# Patient Record
Sex: Female | Born: 1955 | Race: Black or African American | Hispanic: No | Smoking: Never smoker
Health system: Southern US, Community
[De-identification: ages and names within clinical notes are randomized; demographics above are authoritative.]

## PROBLEM LIST (undated history)

## (undated) DIAGNOSIS — K219 Gastro-esophageal reflux disease without esophagitis: Secondary | ICD-10-CM

## (undated) DIAGNOSIS — M1990 Unspecified osteoarthritis, unspecified site: Secondary | ICD-10-CM

## (undated) DIAGNOSIS — I10 Essential (primary) hypertension: Secondary | ICD-10-CM

## (undated) HISTORY — PX: CHOLECYSTECTOMY: SHX55

## (undated) HISTORY — PX: TUBAL LIGATION: SHX77

---

## 1998-07-10 ENCOUNTER — Emergency Department (HOSPITAL_COMMUNITY): Admission: EM | Admit: 1998-07-10 | Discharge: 1998-07-10 | Payer: Self-pay | Admitting: Emergency Medicine

## 1998-11-11 ENCOUNTER — Encounter: Admission: RE | Admit: 1998-11-11 | Discharge: 1999-02-09 | Payer: Self-pay | Admitting: Anesthesiology

## 1999-04-22 ENCOUNTER — Other Ambulatory Visit: Admission: RE | Admit: 1999-04-22 | Discharge: 1999-04-22 | Payer: Self-pay | Admitting: *Deleted

## 2000-05-29 ENCOUNTER — Other Ambulatory Visit: Admission: RE | Admit: 2000-05-29 | Discharge: 2000-05-29 | Payer: Self-pay | Admitting: Family Medicine

## 2001-04-19 ENCOUNTER — Encounter: Payer: Self-pay | Admitting: Internal Medicine

## 2001-04-19 ENCOUNTER — Ambulatory Visit (HOSPITAL_COMMUNITY): Admission: RE | Admit: 2001-04-19 | Discharge: 2001-04-19 | Payer: Self-pay | Admitting: Internal Medicine

## 2001-05-27 ENCOUNTER — Other Ambulatory Visit: Admission: RE | Admit: 2001-05-27 | Discharge: 2001-05-27 | Payer: Self-pay | Admitting: Family Medicine

## 2002-02-28 ENCOUNTER — Encounter: Payer: Self-pay | Admitting: *Deleted

## 2002-02-28 ENCOUNTER — Encounter: Admission: RE | Admit: 2002-02-28 | Discharge: 2002-02-28 | Payer: Self-pay | Admitting: *Deleted

## 2002-07-02 ENCOUNTER — Emergency Department (HOSPITAL_COMMUNITY): Admission: EM | Admit: 2002-07-02 | Discharge: 2002-07-02 | Payer: Self-pay | Admitting: Emergency Medicine

## 2002-07-02 ENCOUNTER — Encounter: Payer: Self-pay | Admitting: Emergency Medicine

## 2002-07-07 ENCOUNTER — Other Ambulatory Visit: Admission: RE | Admit: 2002-07-07 | Discharge: 2002-07-07 | Payer: Self-pay | Admitting: Family Medicine

## 2002-07-19 ENCOUNTER — Encounter: Payer: Self-pay | Admitting: Orthopedic Surgery

## 2002-07-19 ENCOUNTER — Ambulatory Visit (HOSPITAL_COMMUNITY): Admission: RE | Admit: 2002-07-19 | Discharge: 2002-07-19 | Payer: Self-pay | Admitting: Orthopedic Surgery

## 2002-10-20 ENCOUNTER — Ambulatory Visit (HOSPITAL_COMMUNITY)
Admission: RE | Admit: 2002-10-20 | Discharge: 2002-10-20 | Payer: Self-pay | Admitting: Physical Medicine and Rehabilitation

## 2002-10-20 ENCOUNTER — Encounter: Payer: Self-pay | Admitting: Physical Medicine and Rehabilitation

## 2003-06-17 ENCOUNTER — Encounter: Admission: RE | Admit: 2003-06-17 | Discharge: 2003-06-17 | Payer: Self-pay | Admitting: Family Medicine

## 2003-07-08 ENCOUNTER — Other Ambulatory Visit: Admission: RE | Admit: 2003-07-08 | Discharge: 2003-07-08 | Payer: Self-pay | Admitting: Family Medicine

## 2004-03-15 ENCOUNTER — Ambulatory Visit: Payer: Self-pay | Admitting: Family Medicine

## 2004-08-19 ENCOUNTER — Ambulatory Visit: Payer: Self-pay | Admitting: Family Medicine

## 2004-08-19 ENCOUNTER — Other Ambulatory Visit: Admission: RE | Admit: 2004-08-19 | Discharge: 2004-08-19 | Payer: Self-pay | Admitting: Family Medicine

## 2004-10-27 ENCOUNTER — Ambulatory Visit: Payer: Self-pay | Admitting: Family Medicine

## 2006-06-18 ENCOUNTER — Encounter: Payer: Self-pay | Admitting: *Deleted

## 2006-06-18 ENCOUNTER — Encounter (INDEPENDENT_AMBULATORY_CARE_PROVIDER_SITE_OTHER): Payer: Self-pay | Admitting: Specialist

## 2006-06-18 ENCOUNTER — Other Ambulatory Visit: Admission: RE | Admit: 2006-06-18 | Discharge: 2006-06-18 | Payer: Self-pay | Admitting: Family Medicine

## 2006-06-18 ENCOUNTER — Ambulatory Visit: Payer: Self-pay | Admitting: Family Medicine

## 2006-06-18 DIAGNOSIS — R51 Headache: Secondary | ICD-10-CM | POA: Insufficient documentation

## 2006-06-18 DIAGNOSIS — R519 Headache, unspecified: Secondary | ICD-10-CM | POA: Insufficient documentation

## 2006-06-18 DIAGNOSIS — R87619 Unspecified abnormal cytological findings in specimens from cervix uteri: Secondary | ICD-10-CM | POA: Insufficient documentation

## 2006-06-18 DIAGNOSIS — I10 Essential (primary) hypertension: Secondary | ICD-10-CM | POA: Insufficient documentation

## 2006-06-18 LAB — CONVERTED CEMR LAB
Bilirubin Urine: NEGATIVE
Blood in Urine, dipstick: NEGATIVE
Glucose, Urine, Semiquant: NEGATIVE
Ketones, urine, test strip: NEGATIVE
Nitrite: NEGATIVE
Pap Smear: ABNORMAL
Protein, U semiquant: NEGATIVE
Specific Gravity, Urine: 1.015
Urobilinogen, UA: 0.2
pH: 6.5

## 2006-09-18 ENCOUNTER — Ambulatory Visit: Payer: Self-pay | Admitting: Family Medicine

## 2006-09-18 DIAGNOSIS — Z87440 Personal history of urinary (tract) infections: Secondary | ICD-10-CM | POA: Insufficient documentation

## 2006-09-18 LAB — CONVERTED CEMR LAB
Bilirubin Urine: NEGATIVE
Glucose, Urine, Semiquant: NEGATIVE
Ketones, urine, test strip: NEGATIVE
Nitrite: NEGATIVE
Protein, U semiquant: NEGATIVE
Specific Gravity, Urine: 1.02
Urobilinogen, UA: NEGATIVE
pH: 6

## 2006-09-19 ENCOUNTER — Encounter: Payer: Self-pay | Admitting: Family Medicine

## 2006-09-20 ENCOUNTER — Encounter: Payer: Self-pay | Admitting: Family Medicine

## 2006-09-24 ENCOUNTER — Encounter (INDEPENDENT_AMBULATORY_CARE_PROVIDER_SITE_OTHER): Payer: Self-pay | Admitting: *Deleted

## 2007-07-03 ENCOUNTER — Encounter: Payer: Self-pay | Admitting: Family Medicine

## 2007-07-04 ENCOUNTER — Encounter (INDEPENDENT_AMBULATORY_CARE_PROVIDER_SITE_OTHER): Payer: Self-pay | Admitting: *Deleted

## 2008-04-18 ENCOUNTER — Emergency Department (HOSPITAL_BASED_OUTPATIENT_CLINIC_OR_DEPARTMENT_OTHER): Admission: EM | Admit: 2008-04-18 | Discharge: 2008-04-18 | Payer: Self-pay | Admitting: Emergency Medicine

## 2010-06-21 LAB — DIFFERENTIAL
Basophils Absolute: 0 10*3/uL (ref 0.0–0.1)
Lymphocytes Relative: 6 % — ABNORMAL LOW (ref 12–46)
Lymphs Abs: 0.6 10*3/uL — ABNORMAL LOW (ref 0.7–4.0)
Monocytes Absolute: 0.5 10*3/uL (ref 0.1–1.0)
Monocytes Relative: 5 % (ref 3–12)
Neutro Abs: 8 10*3/uL — ABNORMAL HIGH (ref 1.7–7.7)

## 2010-06-21 LAB — COMPREHENSIVE METABOLIC PANEL
Albumin: 4.4 g/dL (ref 3.5–5.2)
BUN: 17 mg/dL (ref 6–23)
Calcium: 9.7 mg/dL (ref 8.4–10.5)
Creatinine, Ser: 0.8 mg/dL (ref 0.4–1.2)
Total Bilirubin: 1 mg/dL (ref 0.3–1.2)
Total Protein: 8.4 g/dL — ABNORMAL HIGH (ref 6.0–8.3)

## 2010-06-21 LAB — CBC
HCT: 43.5 % (ref 36.0–46.0)
MCHC: 32.3 g/dL (ref 30.0–36.0)
MCV: 90.8 fL (ref 78.0–100.0)
Platelets: 266 10*3/uL (ref 150–400)
RDW: 13.5 % (ref 11.5–15.5)

## 2011-02-26 ENCOUNTER — Other Ambulatory Visit: Payer: Self-pay

## 2011-02-26 ENCOUNTER — Emergency Department (INDEPENDENT_AMBULATORY_CARE_PROVIDER_SITE_OTHER): Payer: Managed Care, Other (non HMO)

## 2011-02-26 ENCOUNTER — Encounter: Payer: Self-pay | Admitting: Family Medicine

## 2011-02-26 ENCOUNTER — Emergency Department (HOSPITAL_BASED_OUTPATIENT_CLINIC_OR_DEPARTMENT_OTHER)
Admission: EM | Admit: 2011-02-26 | Discharge: 2011-02-26 | Disposition: A | Discharge status: Other Acute Inpatient Hospital | Payer: Managed Care, Other (non HMO) | Attending: Emergency Medicine | Admitting: Emergency Medicine

## 2011-02-26 DIAGNOSIS — R1013 Epigastric pain: Secondary | ICD-10-CM

## 2011-02-26 DIAGNOSIS — M549 Dorsalgia, unspecified: Secondary | ICD-10-CM

## 2011-02-26 DIAGNOSIS — I10 Essential (primary) hypertension: Secondary | ICD-10-CM | POA: Insufficient documentation

## 2011-02-26 DIAGNOSIS — Z79899 Other long term (current) drug therapy: Secondary | ICD-10-CM | POA: Insufficient documentation

## 2011-02-26 DIAGNOSIS — R079 Chest pain, unspecified: Secondary | ICD-10-CM | POA: Insufficient documentation

## 2011-02-26 DIAGNOSIS — K8020 Calculus of gallbladder without cholecystitis without obstruction: Secondary | ICD-10-CM

## 2011-02-26 DIAGNOSIS — I441 Atrioventricular block, second degree: Secondary | ICD-10-CM | POA: Insufficient documentation

## 2011-02-26 DIAGNOSIS — R110 Nausea: Secondary | ICD-10-CM | POA: Insufficient documentation

## 2011-02-26 DIAGNOSIS — J9819 Other pulmonary collapse: Secondary | ICD-10-CM

## 2011-02-26 DIAGNOSIS — K219 Gastro-esophageal reflux disease without esophagitis: Secondary | ICD-10-CM | POA: Insufficient documentation

## 2011-02-26 DIAGNOSIS — R109 Unspecified abdominal pain: Secondary | ICD-10-CM

## 2011-02-26 HISTORY — DX: Gastro-esophageal reflux disease without esophagitis: K21.9

## 2011-02-26 HISTORY — DX: Essential (primary) hypertension: I10

## 2011-02-26 LAB — COMPREHENSIVE METABOLIC PANEL
Alkaline Phosphatase: 106 U/L (ref 39–117)
BUN: 17 mg/dL (ref 6–23)
CO2: 27 mEq/L (ref 19–32)
GFR calc Af Amer: 82 mL/min — ABNORMAL LOW (ref >90)
GFR calc non Af Amer: 71 mL/min — ABNORMAL LOW (ref >90)
Glucose, Bld: 99 mg/dL (ref 70–99)
Potassium: 4.2 mEq/L (ref 3.5–5.1)
Total Protein: 7.5 g/dL (ref 6.0–8.3)

## 2011-02-26 LAB — CARDIAC PANEL(CRET KIN+CKTOT+MB+TROPI)
CK, MB: 2.1 ng/mL (ref 0.3–4.0)
CK, MB: 2.1 ng/mL (ref 0.3–4.0)
Relative Index: 1.3 (ref 0.0–2.5)
Total CK: 167 U/L (ref 7–177)
Troponin I: <0.3 ng/mL (ref <0.30)

## 2011-02-26 LAB — DIFFERENTIAL
Eosinophils Absolute: 0.1 10*3/uL (ref 0.0–0.7)
Eosinophils Relative: 1 % (ref 0–5)
Lymphocytes Relative: 39 % (ref 12–46)
Lymphs Abs: 2.1 10*3/uL (ref 0.7–4.0)
Monocytes Relative: 10 % (ref 3–12)
Neutrophils Relative %: 49 % (ref 43–77)

## 2011-02-26 LAB — LIPASE, BLOOD: Lipase: 31 U/L (ref 11–59)

## 2011-02-26 LAB — URINALYSIS, ROUTINE W REFLEX MICROSCOPIC
Bilirubin Urine: NEGATIVE
Hgb urine dipstick: NEGATIVE
Ketones, ur: NEGATIVE mg/dL
Nitrite: NEGATIVE
Protein, ur: NEGATIVE mg/dL
Specific Gravity, Urine: 1.017 (ref 1.005–1.030)
Urobilinogen, UA: 0.2 mg/dL (ref 0.0–1.0)

## 2011-02-26 LAB — CBC
Hemoglobin: 12.6 g/dL (ref 12.0–15.0)
MCH: 29.6 pg (ref 26.0–34.0)
MCV: 88 fL (ref 78.0–100.0)
RBC: 4.26 MIL/uL (ref 3.87–5.11)
WBC: 5.3 10*3/uL (ref 4.0–10.5)

## 2011-02-26 MED ORDER — IOHEXOL 300 MG/ML  SOLN
20.0000 mL | INTRAMUSCULAR | Status: AC
  Administered 2011-02-26 (×2): 20 mL via ORAL

## 2011-02-26 MED ORDER — SODIUM CHLORIDE 0.9 % IV BOLUS (SEPSIS)
1000.0000 mL | Freq: Once | INTRAVENOUS | Status: AC
  Administered 2011-02-26: 1000 mL via INTRAVENOUS

## 2011-02-26 MED ORDER — ONDANSETRON HCL 4 MG/2ML IJ SOLN
INTRAMUSCULAR | Status: AC
  Administered 2011-02-26: 4 mg via INTRAVENOUS
  Filled 2011-02-26: qty 2

## 2011-02-26 MED ORDER — ACETAMINOPHEN 80 MG PO CHEW: 324.0000 mg | CHEWABLE_TABLET | Freq: Once | ORAL | Status: DC

## 2011-02-26 MED ORDER — ONDANSETRON HCL 4 MG/2ML IJ SOLN
4.0000 mg | Freq: Once | INTRAMUSCULAR | Status: AC
  Administered 2011-02-26: 4 mg via INTRAVENOUS

## 2011-02-26 MED ORDER — ONDANSETRON HCL 4 MG/2ML IJ SOLN
INTRAMUSCULAR | Status: AC
  Filled 2011-02-26: qty 2

## 2011-02-26 MED ORDER — MORPHINE SULFATE 4 MG/ML IJ SOLN
4.0000 mg | Freq: Once | INTRAMUSCULAR | Status: AC
  Administered 2011-02-26: 4 mg via INTRAVENOUS
  Filled 2011-02-26: qty 1

## 2011-02-26 MED ORDER — IOHEXOL 300 MG/ML  SOLN
100.0000 mL | Freq: Once | INTRAMUSCULAR | Status: AC | PRN
  Administered 2011-02-26: 100 mL via INTRAVENOUS

## 2011-02-26 MED ORDER — NITROGLYCERIN 0.4 MG SL SUBL
0.4000 mg | SUBLINGUAL_TABLET | SUBLINGUAL | Status: DC | PRN
  Filled 2011-02-26: qty 25

## 2011-02-26 MED ORDER — ASPIRIN 81 MG PO CHEW
324.0000 mg | CHEWABLE_TABLET | Freq: Once | ORAL | Status: AC
  Administered 2011-02-26: 324 mg via ORAL

## 2011-02-26 MED ORDER — ASPIRIN 81 MG PO CHEW
CHEWABLE_TABLET | ORAL | Status: AC
  Administered 2011-02-26: 324 mg via ORAL
  Filled 2011-02-26: qty 4

## 2011-02-26 NOTE — ED Notes (Signed)
Pt had 2 episodes of brady with heart rate dropping in the 30s on both occasions. Pt sts she felt nauseated during both episodes. Dr. Hyman Hopes at bedside immediately following.

## 2011-02-26 NOTE — ED Provider Notes (Signed)
History     CSN: [id number]  Arrival date & time 02/26/11  4098   First MD Initiated Contact with Patient 02/26/11 1008      Chief Complaint  Patient presents with  . Abdominal Pain    (Consider location/radiation/quality/duration/timing/severity/associated sxs/prior treatment) HPI  55yoF h/o hypertension, GERD presents with epigastric pain. Patient states that she began to experience dull twisting epigastric pain since about 2 hours prior to arrival. There is sudden onset nausea as well. She denies any radiation of the pain. Denies back pain, jaw pain, shoulder pain. She denies numbness, tingling, weakness of her extremities. She denies fever, chills, cough. She denies diaphoresis. She had a history of similar approximately one week ago. She has not tried to tolerate by mouth today but has had no vomiting. Denies hematuria/dysuria/freq/urgency. Denies vaginal discharge, no lower abdominal pain. The patient believes her pain is related to her meal last night but she did not have pain at that time. Denies h/o VTE in self or family. No recent hosp/surg/immob. No h/o cancer. Denies exogenous hormone use, no leg pain or swelling. Does not feel like her GERD. No known h/o gallstones.   ED Notes, ED Provider Notes from 02/26/11 0000 to 02/26/11 10:17:12       Vickie Boston Service, RN 02/26/2011 10:14      Pt c/o epigastric pain onset this morning with similar episode 2 wks ago. Pt also c/o nausea. Pt sts pain "shoots through to back".     Past Medical History  Diagnosis Date  . Hypertension   . GERD (gastroesophageal reflux disease)     History reviewed. No pertinent past surgical history.  No family history on file.  History  Substance Use Topics  . Smoking status: Never Smoker   . Smokeless tobacco: Not on file  . Alcohol Use: No    OB History    Grav Para Term Preterm Abortions TAB SAB Ect Mult Living                  Review of Systems  All other systems reviewed and  are negative.   except as noted HPI  Allergies  Codeine  Home Medications   Current Outpatient Rx  Name Route Sig Dispense Refill  . ATENOLOL 25 MG PO TABS Oral Take 25 mg by mouth daily.      Marland Kitchen OMEPRAZOLE 40 MG PO CPDR Oral Take 40 mg by mouth daily.        BP 139/78  Pulse 67  Temp(Src) 97.4 F (36.3 C) (Oral)  Resp 16  Ht 5\' 11"  (1.803 m)  Wt 260 lb (117.935 kg)  BMI 36.26 kg/m2  SpO2 100%  Physical Exam  Nursing note and vitals reviewed. Constitutional: She is oriented to person, place, and time. She appears well-developed.  HENT:  Head: Atraumatic.  Mouth/Throat: Oropharynx is clear and moist.  Eyes: Conjunctivae and EOM are normal. Pupils are equal, round, and reactive to light.  Neck: Normal range of motion. Neck supple.  Cardiovascular: Normal rate, regular rhythm, normal heart sounds and intact distal pulses.   Pulmonary/Chest: Effort normal and breath sounds normal. No respiratory distress. She has no wheezes. She has no rales.  Abdominal: Soft. She exhibits no distension. There is no tenderness. There is no rebound and no guarding.  Musculoskeletal: Normal range of motion.  Neurological: She is alert and oriented to person, place, and time.  Skin: Skin is warm and dry. No rash noted.  Psychiatric: She has a normal mood  and affect.    Date: 02/26/2011  Rate: 66  Rhythm: normal sinus rhythm  QRS Axis: normal  Intervals: normal  ST/T Wave abnormalities: nonspecific ST changes diffuse ste > inferior leads, no PR depression min ST depression aVR only  Conduction Disutrbances:none  Narrative Interpretation:   Old EKG Reviewed: none available   Date: 02/26/2011  Rate: 61  Rhythm: normal sinus rhythm  QRS Axis: normal  Intervals: normal  ST/T Wave abnormalities: nonspecific ST changes diffuse leads > anterior. ST depression aVR only  Conduction Disutrbances:none  Narrative Interpretation:   Old EKG Reviewed: unchanged   Date: 02/26/2011  Rate: 58   Rhythm: normal sinus rhythm  QRS Axis: normal  Intervals: normal  ST/T Wave abnormalities: nonspecific STE, diffuse, no PR depression  Conduction Disutrbances:none  Narrative Interpretation:   Old EKG Reviewed: unchanged  Rhythm strips- patient with severe nausea, Telemetry alarm review with multiple episodes of p waves with no conduction to ventricles, Longest > 5 ec. Mobitz type II?Marland Kitchen Ventricular standstill?  ED Course  Procedures (including critical care time)  Labs Reviewed  COMPREHENSIVE METABOLIC PANEL - Abnormal; Notable for the following:    GFR calc non Af Amer 71 (*)    GFR calc Af Amer 82 (*)    All other components within normal limits  CARDIAC PANEL(CRET KIN+CKTOT+MB+TROPI) - Abnormal; Notable for the following:    Total CK 182 (*)    All other components within normal limits  CBC  DIFFERENTIAL  LIPASE, BLOOD  URINALYSIS, ROUTINE W REFLEX MICROSCOPIC  CARDIAC PANEL(CRET KIN+CKTOT+MB+TROPI)   Dg Chest 2 View  02/26/2011  *RADIOLOGY REPORT*  Clinical Data: Epigastric pain.  CHEST - 2 VIEW  Comparison: Chest x-ray 06/17/2003  Findings: Low lung volumes with minimal bibasilar atelectasis. Heart is normal size.  No effusions or bony abnormality.  IMPRESSION: Low lung volumes, bibasilar atelectasis.  Original Report Authenticated By: Cyndie Chime, M.D.   Ct Abdomen Pelvis W Contrast  02/26/2011  *RADIOLOGY REPORT*  Clinical Data: Abdominal pain radiating to back.  CT ABDOMEN AND PELVIS WITH CONTRAST  Technique:  Multidetector CT imaging of the abdomen and pelvis was performed following the standard protocol during bolus administration of intravenous contrast.  Contrast: OMNIPAQUE IOHEXOL 300 MG/ML IV SOLN  Comparison: None.  Findings: Cholelithiasis is noted, however there is no evidence of cholecystitis.  The liver, pancreas, spleen, adrenal glands, and kidneys are normal in appearance.  No evidence of hydronephrosis. No soft tissue masses or lymphadenopathy identified  within the abdomen or pelvis.  The uterus and adnexa are unremarkable in appearance.  No evidence of inflammatory process or abnormal fluid collections within the abdomen or pelvis.  No evidence of bowel wall thickening or dilatation.  Normal appendix is visualized.  IMPRESSION:  Cholelithiasis.  No evidence of cholecystitis or other acute findings.  Original Report Authenticated By: Danae Orleans, M.D.     1. Epigastric pain   2. Chest pain   3. Mobitz II     MDM   Patient presents with what she perceives to be epigastric pain. The etiology of her pain is unclear at this time. She does not have any reproducible abdominal tenderness to palpation. Her CT abdomen pelvis is negative for acute intraabdominal process. SHe does have gallstones but I doubt this is the etiology of her pain based on her description of the pain. She's had 2 sets of cardiac enzymes were negative here. Her CMP, lipase unremarkable. Her symptoms are less consistent with acute dissection. She  has received IV fluids, Zofran, morphine, aspirin, nitroglycerin. Her pain is 3/10 at this time. Patient also with arrhythmias on telemetry strip. I discussed this with Dr. Tereso Newcomer [facility]. He states that they will see the patient in consultation. I also discussed with Dr. Maisie Fus as part of [facility] at [facility]. She has accepted the patient to a telemetry bed for further workup and evaluation.        Forbes Cellar, MD 02/26/11 [phone]

## 2011-02-26 NOTE — ED Notes (Signed)
Pt reports episode of nausea that has since resolved.

## 2011-02-26 NOTE — ED Notes (Signed)
MD at bedside. 

## 2011-02-26 NOTE — ED Notes (Signed)
Report called to [facility] Lurena Joiner, RN on [facility].

## 2011-02-26 NOTE — ED Notes (Signed)
Pt c/o epigastric pain onset this morning with similar episode 2 wks ago. Pt also c/o nausea. Pt sts pain "shoots through to back".

## 2011-02-26 NOTE — ED Notes (Signed)
Patient transported to X-ray 

## 2012-01-26 ENCOUNTER — Encounter (HOSPITAL_BASED_OUTPATIENT_CLINIC_OR_DEPARTMENT_OTHER): Payer: Self-pay | Admitting: Family Medicine

## 2012-01-26 ENCOUNTER — Emergency Department (HOSPITAL_BASED_OUTPATIENT_CLINIC_OR_DEPARTMENT_OTHER)
Admission: EM | Admit: 2012-01-26 | Discharge: 2012-01-26 | Disposition: A | Discharge status: Home / Self Care | Payer: Managed Care, Other (non HMO) | Attending: Emergency Medicine | Admitting: Emergency Medicine

## 2012-01-26 DIAGNOSIS — Y929 Unspecified place or not applicable: Secondary | ICD-10-CM | POA: Insufficient documentation

## 2012-01-26 DIAGNOSIS — I10 Essential (primary) hypertension: Secondary | ICD-10-CM | POA: Insufficient documentation

## 2012-01-26 DIAGNOSIS — Y939 Activity, unspecified: Secondary | ICD-10-CM | POA: Insufficient documentation

## 2012-01-26 DIAGNOSIS — R42 Dizziness and giddiness: Secondary | ICD-10-CM | POA: Insufficient documentation

## 2012-01-26 DIAGNOSIS — T887XXA Unspecified adverse effect of drug or medicament, initial encounter: Secondary | ICD-10-CM | POA: Insufficient documentation

## 2012-01-26 DIAGNOSIS — T50905A Adverse effect of unspecified drugs, medicaments and biological substances, initial encounter: Secondary | ICD-10-CM

## 2012-01-26 DIAGNOSIS — K219 Gastro-esophageal reflux disease without esophagitis: Secondary | ICD-10-CM | POA: Insufficient documentation

## 2012-01-26 DIAGNOSIS — Z79899 Other long term (current) drug therapy: Secondary | ICD-10-CM | POA: Insufficient documentation

## 2012-01-26 DIAGNOSIS — R55 Syncope and collapse: Secondary | ICD-10-CM | POA: Insufficient documentation

## 2012-01-26 LAB — CBC WITH DIFFERENTIAL/PLATELET
Basophils Relative: 0 % (ref 0–1)
Eosinophils Absolute: 0.1 10*3/uL (ref 0.0–0.7)
Eosinophils Relative: 2 % (ref 0–5)
Lymphs Abs: 3.2 10*3/uL (ref 0.7–4.0)
MCH: 30.2 pg (ref 26.0–34.0)
MCHC: 35 g/dL (ref 30.0–36.0)
MCV: 86.4 fL (ref 78.0–100.0)
Neutrophils Relative %: 45 % (ref 43–77)
Platelets: 263 10*3/uL (ref 150–400)
RDW: 13.3 % (ref 11.5–15.5)

## 2012-01-26 LAB — BASIC METABOLIC PANEL
BUN: 13 mg/dL (ref 6–23)
CO2: 25 mEq/L (ref 19–32)
Chloride: 101 mEq/L (ref 96–112)
Creatinine, Ser: 0.8 mg/dL (ref 0.50–1.10)
Glucose, Bld: 97 mg/dL (ref 70–99)

## 2012-01-26 LAB — GLUCOSE, CAPILLARY: Glucose-Capillary: 104 mg/dL — ABNORMAL HIGH (ref 70–99)

## 2012-01-26 LAB — TROPONIN I: Troponin I: <0.3 ng/mL (ref <0.30)

## 2012-01-26 NOTE — ED Notes (Signed)
The patient's CBG was 104. 

## 2012-01-26 NOTE — ED Provider Notes (Signed)
History     CSN: [id number]  Arrival date & time 01/26/12  4098   First MD Initiated Contact with Patient 01/26/12 [phone]      Chief Complaint  Patient presents with  . Medication Reaction    (Consider location/radiation/quality/duration/timing/severity/associated sxs/prior treatment) HPI Comments: Patient presents here with palpitations, feeling light-headed after taking the very first dose of the "Dr. Neil Crouch weight loss pill."  She reports feeling dizzy and her heart racing and felt as though she was going to pass out.  She denies chest pain or shortness of breath.  She also denies any recent illnesses and states that she felt fine this morning when she woke.  These symptoms started one hour after taking this medication.    The history is provided by the patient.    Past Medical History  Diagnosis Date  . Hypertension   . GERD (gastroesophageal reflux disease)     Past Surgical History  Procedure Date  . Cholecystectomy   . Tubal ligation     No family history on file.  History  Substance Use Topics  . Smoking status: Never Smoker   . Smokeless tobacco: Not on file  . Alcohol Use: No    OB History    Grav Para Term Preterm Abortions TAB SAB Ect Mult Living                  Review of Systems  All other systems reviewed and are negative.    Allergies  Codeine and Ivp dye  Home Medications   Current Outpatient Rx  Name  Route  Sig  Dispense  Refill  . VITAMIN A PO   Oral   Take by mouth.         . ATENOLOL 25 MG PO TABS   Oral   Take 25 mg by mouth daily.           Marland Kitchen OMEPRAZOLE 40 MG PO CPDR   Oral   Take 40 mg by mouth daily.             BP 175/83  Pulse 88  Temp 98.1 F (36.7 C) (Oral)  Resp 16  SpO2 99%  Physical Exam  Vitals reviewed. Constitutional: She is oriented to person, place, and time. She appears well-developed and well-nourished. No distress.  HENT:  Head: Normocephalic and atraumatic.  Mouth/Throat: Oropharynx is  clear and moist.  Eyes: EOM are normal. Pupils are equal, round, and reactive to light.  Neck: Normal range of motion. Neck supple.  Cardiovascular: Normal rate and regular rhythm.   No murmur heard. Pulmonary/Chest: Effort normal and breath sounds normal. No respiratory distress.  Abdominal: Soft. Bowel sounds are normal. She exhibits no distension. There is no tenderness.  Musculoskeletal: Normal range of motion.  Lymphadenopathy:    She has no cervical adenopathy.  Neurological: She is alert and oriented to person, place, and time. No cranial nerve deficit. She exhibits normal muscle tone. Coordination normal.  Skin: Skin is warm and dry. She is not diaphoretic.    ED Course  Procedures (including critical care time)  Labs Reviewed  GLUCOSE, CAPILLARY - Abnormal; Notable for the following:    Glucose-Capillary 104 (*)     All other components within normal limits  CBC WITH DIFFERENTIAL  BASIC METABOLIC PANEL  TROPONIN I   No results found.   No diagnosis found.   Date: 01/26/2012  Rate: 76  Rhythm: normal sinus rhythm  QRS Axis: normal  Intervals: normal  ST/T  Wave abnormalities: normal  Conduction Disutrbances:none  Narrative Interpretation:   Old EKG Reviewed: unchanged    MDM  The patient presents after having a reaction to a diet pill that she started this morning.  The information I could find on this pill lists the active ingredient is garcinia, an herbal apetite suppressant that also blocks the absorption of fat in the GI tract.  The side effects of this are consistent with what I see here today.  She is feeling better and I believe is stable for discharge.  She will return prn if she worsens.        Geoffery Lyons, MD 01/26/12 1113

## 2012-01-26 NOTE — ED Notes (Signed)
Pt sts she took a "diet pill" for the first time this morning at 8:30am and shortly after starting feeling "like I was going to pass out" and became diaphoretic. Pt denies shob, chest pain. Pt sts her heart feels like it's racing.

## 2013-01-29 IMAGING — CT CT ABD-PELV W/ CM
2 of 5 series · 17 of 46 positions shown, 19 images · IV contrast (APPLIED)
Comparison: None.

CLINICAL DATA: Abdominal pain radiating to back.

CT ABDOMEN AND PELVIS WITH CONTRAST
TECHNIQUE: Multidetector CT imaging of the abdomen and pelvis was
performed following the standard protocol during bolus
administration of intravenous contrast.
Contrast: 100mL OMNIPAQUE IOHEXOL 300 MG/ML IV SOLN

[Series 2: abd/pelvis 5.0 b31f · axial · 0.84mm/px · z∈[+1001,+1466]mm · 14 of 103 slices shown, 16 images]
[im 5/103  soft-tissue]
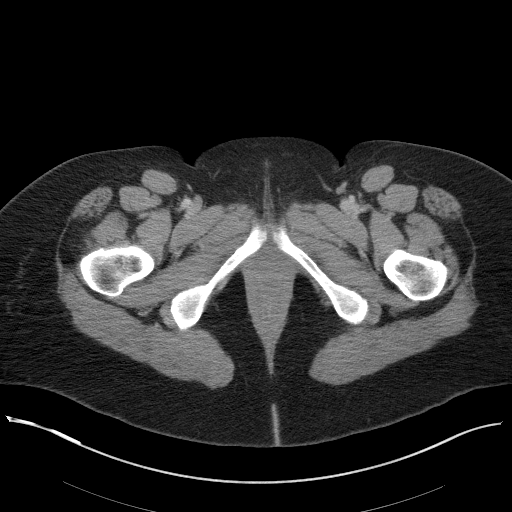
[im 5/103  bone]
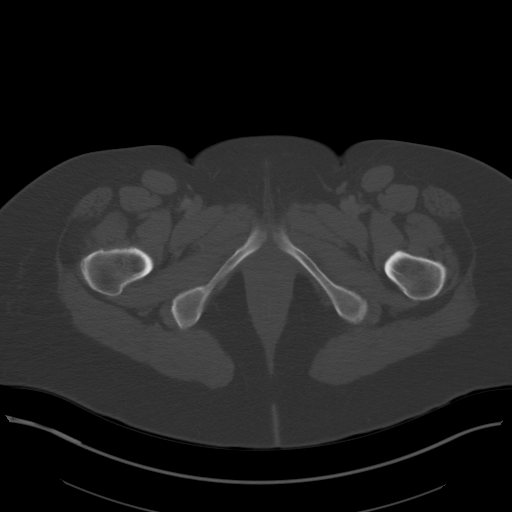
[im 14/103  soft-tissue]
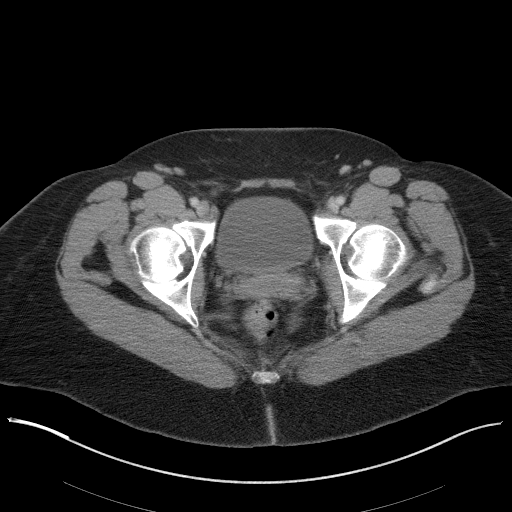
[im 19/103  soft-tissue]
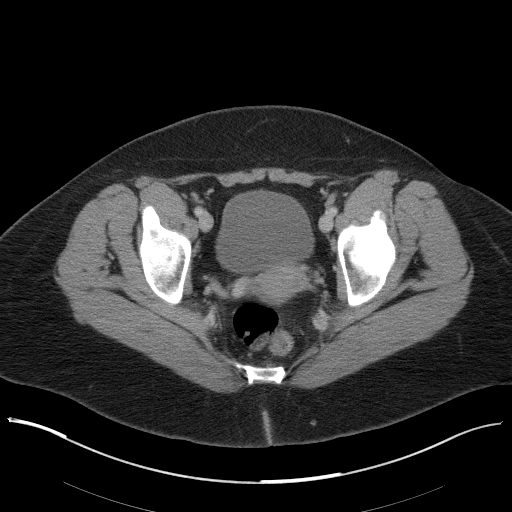
[im 28/103  soft-tissue]
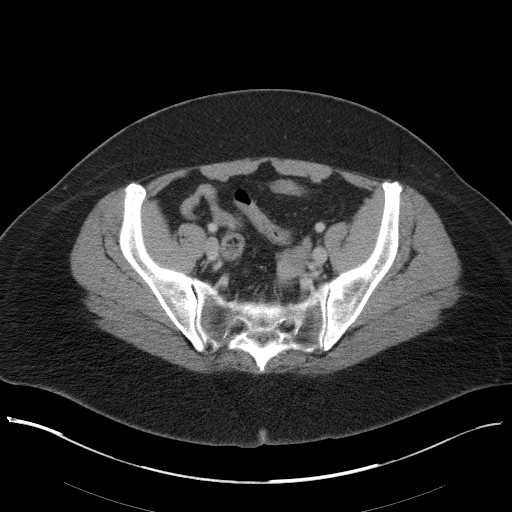
[im 33/103  soft-tissue]
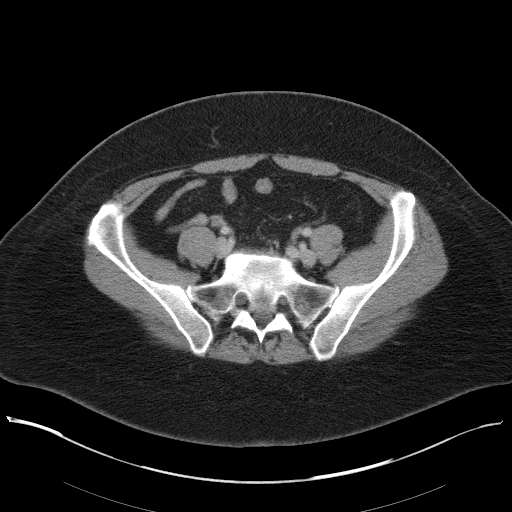
[im 42/103  soft-tissue]
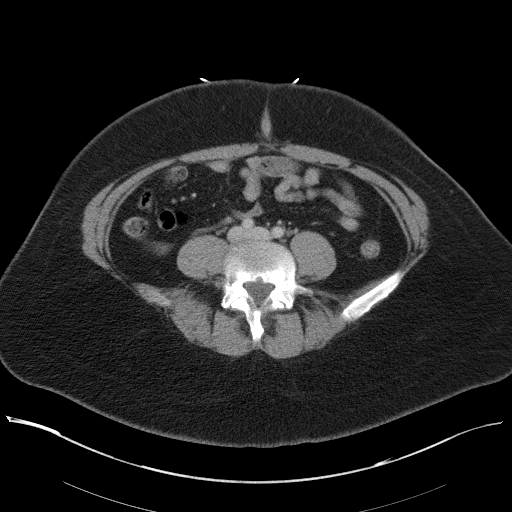
[im 47/103  soft-tissue]
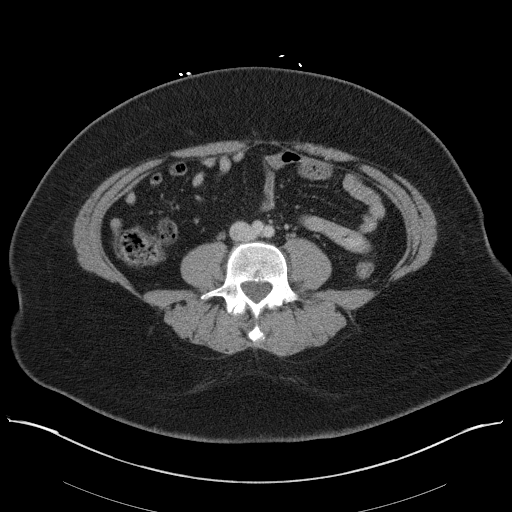
[im 56/103  soft-tissue]
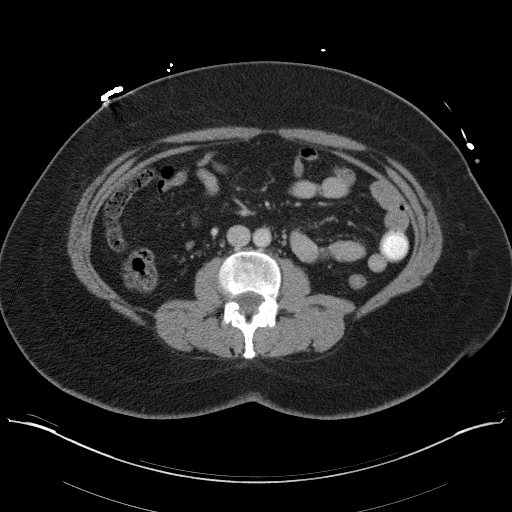
[im 61/103  soft-tissue]
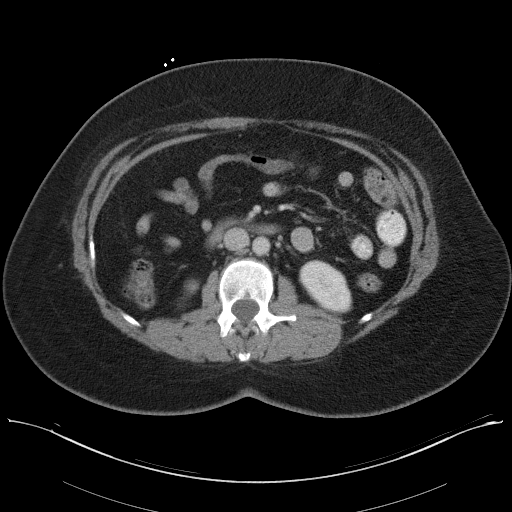
[im 61/103  bone]
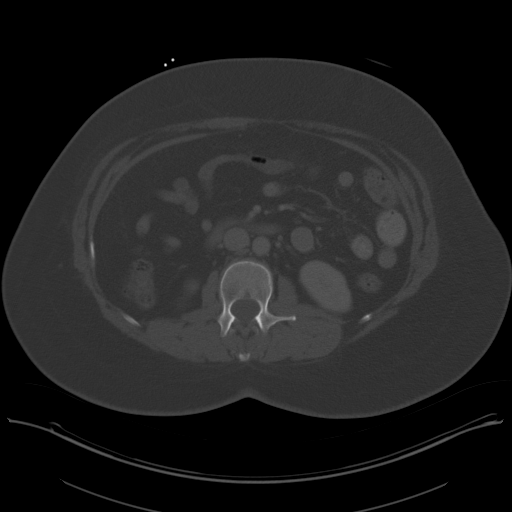
[im 70/103  soft-tissue]
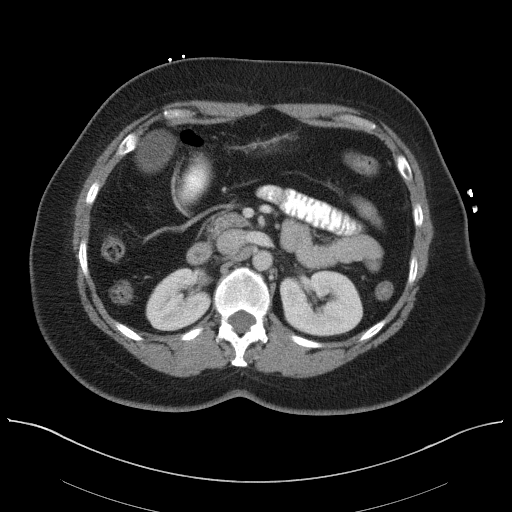
[im 75/103  soft-tissue]
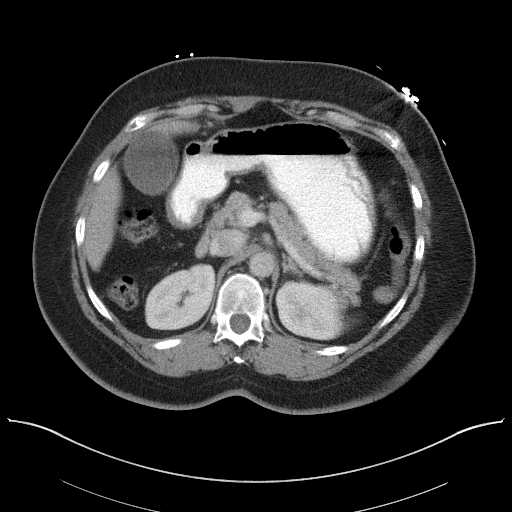
[im 84/103  soft-tissue]
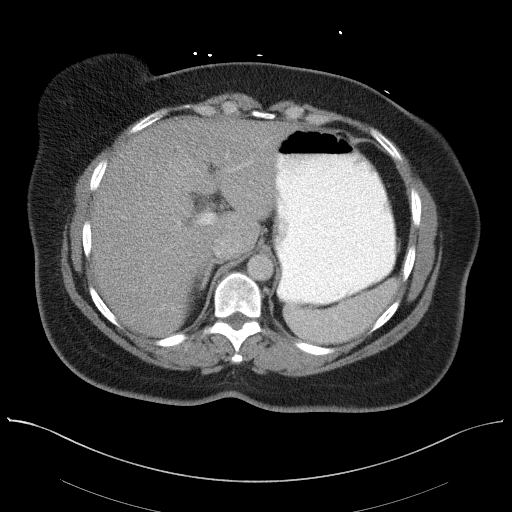
[im 89/103  soft-tissue]
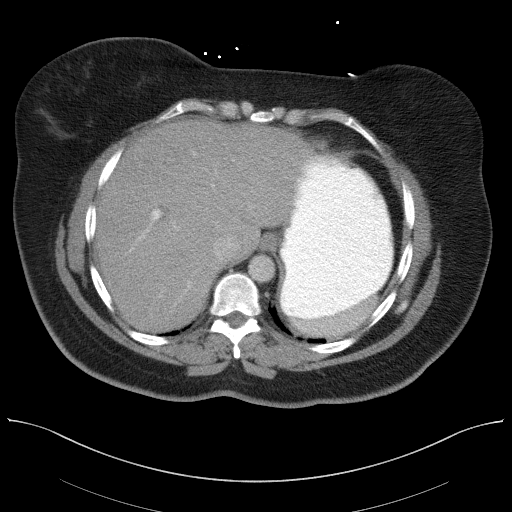
[im 98/103  soft-tissue]
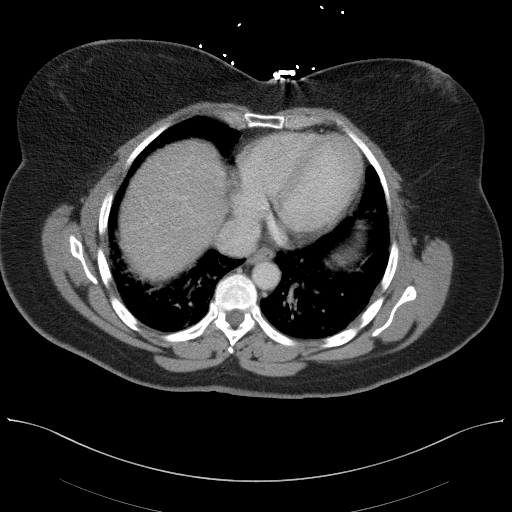

[Series 5: abd/pelvis 3.0 coronal · coronal · 0.96mm/px · 3 of 79 slices shown]
[im 27/79  soft-tissue]
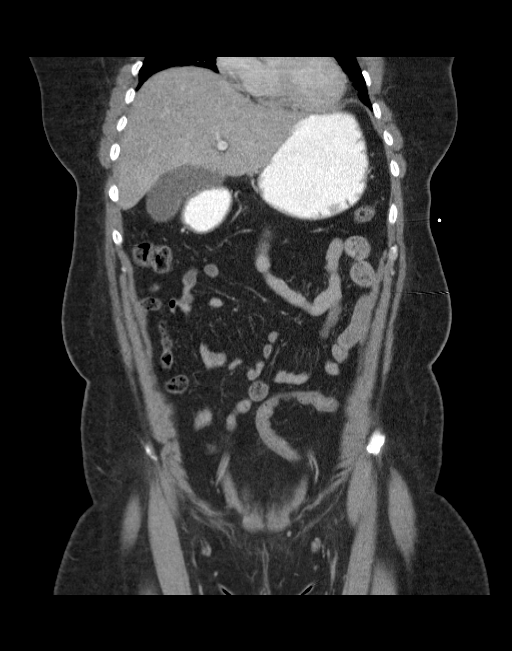
[im 35/79  soft-tissue]
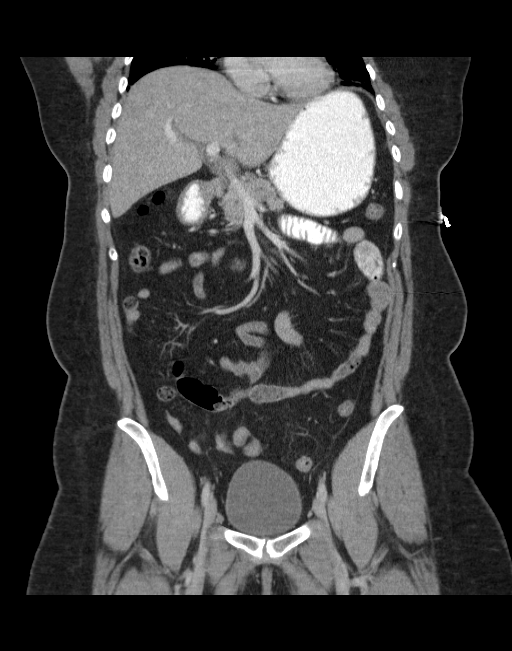
[im 44/79  soft-tissue]
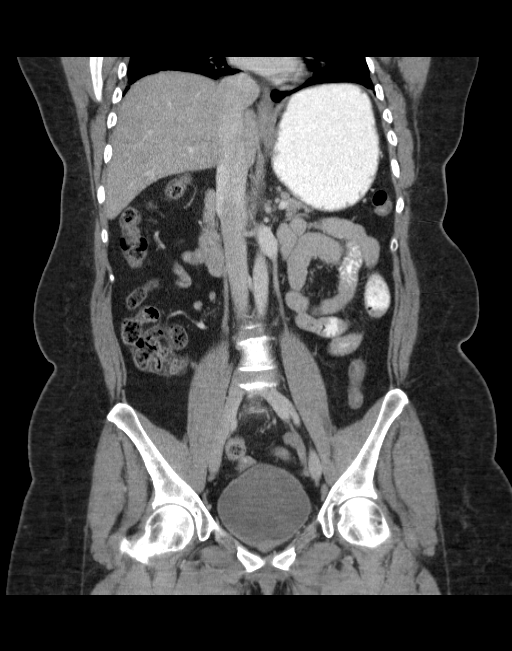

[17 of 46 positions shown; findings below may reference images not displayed]

FINDINGS: Cholelithiasis is noted, however there is no evidence of
cholecystitis.  The liver, pancreas, spleen, adrenal glands, and
kidneys are normal in appearance.  No evidence of hydronephrosis.
No soft tissue masses or lymphadenopathy identified within the
abdomen or pelvis.  The uterus and adnexa are unremarkable in
appearance.

No evidence of inflammatory process or abnormal fluid collections
within the abdomen or pelvis.  No evidence of bowel wall thickening
or dilatation.  Normal appendix is visualized.
IMPRESSION: Cholelithiasis.  No evidence of cholecystitis or other acute
findings.

## 2019-01-26 ENCOUNTER — Emergency Department (HOSPITAL_BASED_OUTPATIENT_CLINIC_OR_DEPARTMENT_OTHER)
Admission: EM | Admit: 2019-01-26 | Discharge: 2019-01-26 | Disposition: A | Discharge status: Home / Self Care | Payer: Worker's Compensation | Attending: Emergency Medicine | Admitting: Emergency Medicine

## 2019-01-26 ENCOUNTER — Encounter (HOSPITAL_BASED_OUTPATIENT_CLINIC_OR_DEPARTMENT_OTHER): Payer: Self-pay | Admitting: Emergency Medicine

## 2019-01-26 ENCOUNTER — Other Ambulatory Visit: Payer: Self-pay

## 2019-01-26 DIAGNOSIS — I10 Essential (primary) hypertension: Secondary | ICD-10-CM | POA: Insufficient documentation

## 2019-01-26 DIAGNOSIS — Z79899 Other long term (current) drug therapy: Secondary | ICD-10-CM | POA: Diagnosis not present

## 2019-01-26 DIAGNOSIS — M545 Low back pain, unspecified: Secondary | ICD-10-CM

## 2019-01-26 MED ORDER — METHOCARBAMOL 500 MG PO TABS: 500.0000 mg | ORAL_TABLET | Freq: Three times a day (TID) | ORAL | 0 refills | Status: DC | PRN

## 2019-01-26 MED ORDER — PREDNISONE 20 MG PO TABS: 40.0000 mg | ORAL_TABLET | Freq: Every day | ORAL | 0 refills | Status: DC

## 2019-01-26 NOTE — ED Provider Notes (Signed)
[facility] EMERGENCY DEPARTMENT Provider Note   CSN: [id number] Arrival date & time: 01/26/19  2831     History   Chief Complaint Chief Complaint  Patient presents with  . Back Pain    HPI Cassie Horne is a 63 y.o. female.     HPI Patient presents with low back pain.  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  It is sharp and felt tearing.  Thank you pain is continued somewhat.  No radiation down the legs.  States she had some difficulty getting out of bed.  No loss of bladder or bowel control but states she did have a hurry to the bathroom because it took longer to get out of bed this morning.  No cancer history.  No IV drug use.  No fevers or chills.  States she had back pain, but that was 20 years ago. Past Medical History:  Diagnosis Date  . GERD (gastroesophageal reflux disease)   . Hypertension     Patient Active Problem List   Diagnosis Date Noted  . HX, URINARY INFECTION 09/18/2006  . HYPERTENSION 06/18/2006  . HEADACHE 06/18/2006  . PAP SMEAR, ABNORMAL 06/18/2006    Past Surgical History:  Procedure Laterality Date  . CHOLECYSTECTOMY    . TUBAL LIGATION       OB History   No obstetric history on file.      Home Medications    Prior to Admission medications   Medication Sig Start Date End Date Taking? Authorizing Provider  atenolol (TENORMIN) 25 MG tablet Take 25 mg by mouth daily.     Yes [provider]  omeprazole (PRILOSEC) 40 MG capsule Take 40 mg by mouth daily.     Yes [provider]  vitamin E (VITAMIN E) 400 UNIT capsule Take 500 Units by mouth daily.   Yes [provider]  methocarbamol (ROBAXIN) 500 MG tablet Take 1 tablet (500 mg total) by mouth every 8 (eight) hours as needed for muscle spasms. 01/26/19   Davonna Belling, MD  predniSONE (DELTASONE) 20 MG tablet Take 2 tablets (40 mg  total) by mouth daily. 01/26/19   Davonna Belling, MD  VITAMIN A PO Take by mouth.    [provider]    Family History No family history on file.  Social History Social History   Tobacco Use  . Smoking status: Never Smoker  . Smokeless tobacco: Never Used  Substance Use Topics  . Alcohol use: No  . Drug use: No     Allergies   Codeine, Ivp dye [iodinated diagnostic agents], and Morphine and related   Review of Systems Review of Systems  Constitutional: Negative for appetite change, chills and fever.  Gastrointestinal: Negative for abdominal pain.  Musculoskeletal: Positive for back pain.  Skin: Negative for rash.  Neurological: Negative for weakness.  Psychiatric/Behavioral: Negative for confusion.     Physical Exam Updated Vital Signs BP (!) 163/92 (BP Location: Right Arm)   Pulse 99   Temp 98.2 F (36.8 C) (Oral)   Resp 20   Ht 5\' 11"  (1.803 m)   Wt 129.7 kg   SpO2 99%   BMI 39.89 kg/m   Physical Exam Vitals signs and nursing note reviewed.  HENT:     Head: Atraumatic.  Abdominal:     Tenderness: There is no abdominal tenderness.  Musculoskeletal:  Comments: Some muscular tenderness over lumbar spine.  No bony tenderness.  Perineal sensation intact.  Good straight leg raise bilaterally.  No abdominal tenderness or mass.  Skin:    General: Skin is warm.     Capillary Refill: Capillary refill takes less than 2 seconds.  Neurological:     Mental Status: She is alert.      ED Treatments / Results  Labs (all labs ordered are listed, but only abnormal results are displayed) Labs Reviewed - No data to display  EKG None  Radiology No results found.  Procedures Procedures (including critical care time)  Medications Ordered in ED Medications - No data to display   Initial Impression / Assessment and Plan / ED Course  I have reviewed the triage vital signs and the nursing notes.  Pertinent labs & imaging results that were  available during my care of the patient were reviewed by me and considered in my medical decision making (see chart for details).       Patient with low back pain.  Likely musculoskeletal.  No red flags.  Will treat symptomatically with Robaxin and prednisone.  Discharge home. Final Clinical Impressions(s) / ED Diagnoses   Final diagnoses:  Acute midline low back pain without sciatica    ED Discharge Orders         Ordered    methocarbamol (ROBAXIN) 500 MG tablet  Every 8 hours PRN     01/26/19 0721    predniSONE (DELTASONE) 20 MG tablet  Daily     01/26/19 0721           Benjiman Core, MD 01/26/19 [phone]

## 2019-01-26 NOTE — ED Triage Notes (Signed)
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  When standing up felt a muscle pull in her lower back.  Pain unrelieved using icy hot and tylenol.

## 2021-09-13 NOTE — Progress Notes (Signed)
Sent message, via epic in basket, requesting order in epic from surgeon     09/13/21 0935  Preop Orders  Has preop orders? No  Name of staff/physician contacted for orders(Indicate phone or IB message) C. Sherral Hammers, PA-C.

## 2021-09-16 NOTE — Progress Notes (Signed)
Anesthesia Review:  PCP: Cardiologist : Chest x-ray : EKG : Echo : Stress test: Cardiac Cath :  Activity level:  Sleep Study/ CPAP : Fasting Blood Sugar :      / Checks Blood Sugar -- times a day:   Blood Thinner/ Instructions /Last Dose: ASA / Instructions/ Last Dose :   Requested orders on 09/16/21.

## 2021-09-16 NOTE — Progress Notes (Signed)
DUE TO COVID-19 ONLY ONE VISITOR IS ALLOWED TO COME WITH YOU AND STAY IN THE WAITING ROOM ONLY DURING PRE OP AND PROCEDURE DAY OF SURGERY.  2 VISITOR  MAY VISIT WITH YOU AFTER SURGERY IN YOUR PRIVATE ROOM DURING VISITING HOURS ONLY! YOU MAY HAVE ONE PERSON SPEND THE NITE WITH YOU IN YOUR ROOM AFTER SURGERY.      Your procedure is scheduled on:        10/10/2021   Report to [facility] Main  Entrance   Report to admitting at    0700              AM DO NOT BRING INSURANCE CARD, PICTURE ID OR WALLET DAY OF SURGERY.      Call this number if you have problems the morning of surgery [phone]    REMEMBER: NO  SOLID FOODS , CANDY, GUM OR MINTS AFTER MIDNITE THE NITE BEFORE SURGERY .       Marland Kitchen CLEAR LIQUIDS UNTIL    0615am             DAY OF SURGERY.   Complete ensure presurgery drink at 0615am morning of surgery.        CLEAR LIQUID DIET   Foods Allowed      WATER BLACK COFFEE ( SUGAR OK, NO MILK, CREAM OR CREAMER) REGULAR AND DECAF  TEA ( SUGAR OK NO MILK, CREAM, OR CREAMER) REGULAR AND DECAF  PLAIN JELLO ( NO RED)  FRUIT ICES ( NO RED, NO FRUIT PULP)  POPSICLES ( NO RED)  JUICE- APPLE, WHITE GRAPE AND WHITE CRANBERRY  SPORT DRINK LIKE GATORADE ( NO RED)  CLEAR BROTH ( VEGETABLE , CHICKEN OR BEEF)                                                                     BRUSH YOUR TEETH MORNING OF SURGERY AND RINSE YOUR MOUTH OUT, NO CHEWING GUM CANDY OR MINTS.     Take these medicines the morning of surgery with A SIP OF WATER:  amlodipine, atenolol, omeprazole    DO NOT TAKE ANY DIABETIC MEDICATIONS DAY OF YOUR SURGERY                               You may not have any metal on your body including hair pins and              piercings  Do not wear jewelry, make-up, lotions, powders or perfumes, deodorant             Do not wear nail polish on your fingernails.              IF YOU ARE A FEMALE AND WANT TO SHAVE UNDER ARMS OR LEGS PRIOR TO SURGERY YOU MUST DO SO AT LEAST 48  HOURS PRIOR TO SURGERY.              Men may shave face and neck.   Do not bring valuables to the hospital. [facility] IS NOT             RESPONSIBLE   FOR VALUABLES.  Contacts, dentures or bridgework may not be worn into surgery.  Leave suitcase in the car. After surgery it may be brought to your room.     Patients discharged the day of surgery will not be allowed to drive home. IF YOU ARE HAVING SURGERY AND GOING HOME THE SAME DAY, YOU MUST HAVE AN ADULT TO DRIVE YOU HOME AND BE WITH YOU FOR 24 HOURS. YOU MAY GO HOME BY TAXI OR UBER OR ORTHERWISE, BUT AN ADULT MUST ACCOMPANY YOU HOME AND STAY WITH YOU FOR 24 HOURS.                Please read over the following fact sheets you were given: _____________________________________________________________________  [facility] - Preparing for Surgery Before surgery, you can play an important role.  Because skin is not sterile, your skin needs to be as free of germs as possible.  You can reduce the number of germs on your skin by washing with CHG (chlorahexidine gluconate) soap before surgery.  CHG is an antiseptic cleaner which kills germs and bonds with the skin to continue killing germs even after washing. Please DO NOT use if you have an allergy to CHG or antibacterial soaps.  If your skin becomes reddened/irritated stop using the CHG and inform your nurse when you arrive at Short Stay. Do not shave (including legs and underarms) for at least 48 hours prior to the first CHG shower.  You may shave your face/neck. Please follow these instructions carefully:  1.  Shower with CHG Soap the night before surgery and the  morning of Surgery.  2.  If you choose to wash your hair, wash your hair first as usual with your  normal  shampoo.  3.  After you shampoo, rinse your hair and body thoroughly to remove the  shampoo.                           4.  Use CHG as you would any other liquid soap.  You can apply chg directly  to the skin and wash                        Gently with a scrungie or clean washcloth.  5.  Apply the CHG Soap to your body ONLY FROM THE NECK DOWN.   Do not use on face/ open                           Wound or open sores. Avoid contact with eyes, ears mouth and genitals (private parts).                       Wash face,  Genitals (private parts) with your normal soap.             6.  Wash thoroughly, paying special attention to the area where your surgery  will be performed.  7.  Thoroughly rinse your body with warm water from the neck down.  8.  DO NOT shower/wash with your normal soap after using and rinsing off  the CHG Soap.                9.  Pat yourself dry with a clean towel.            10.  Wear clean pajamas.            11.  Place clean sheets on your bed the night of your first  shower and do not  sleep with pets. Day of Surgery : Do not apply any lotions/deodorants the morning of surgery.  Please wear clean clothes to the hospital/surgery center.  FAILURE TO FOLLOW THESE INSTRUCTIONS MAY RESULT IN THE CANCELLATION OF YOUR SURGERY PATIENT SIGNATURE_________________________________  NURSE SIGNATURE__________________________________  ________________________________________________________________________

## 2021-09-19 ENCOUNTER — Other Ambulatory Visit: Payer: Self-pay | Admitting: Orthopedic Surgery

## 2021-09-19 DIAGNOSIS — G8929 Other chronic pain: Secondary | ICD-10-CM

## 2021-09-20 ENCOUNTER — Encounter (HOSPITAL_COMMUNITY)
Admission: RE | Admit: 2021-09-20 | Discharge: 2021-09-20 | Disposition: A | Discharge status: Home / Self Care | Payer: No Typology Code available for payment source | Source: Ambulatory Visit | Attending: Orthopedic Surgery | Admitting: Orthopedic Surgery

## 2021-09-20 ENCOUNTER — Encounter (HOSPITAL_COMMUNITY): Payer: Self-pay

## 2021-09-20 ENCOUNTER — Other Ambulatory Visit: Payer: Self-pay

## 2021-09-20 VITALS — BP 141/87 | HR 58 | Temp 98.3°F | Resp 16 | Ht 70.0 in | Wt 271.0 lb

## 2021-09-20 DIAGNOSIS — M25561 Pain in right knee: Secondary | ICD-10-CM | POA: Insufficient documentation

## 2021-09-20 DIAGNOSIS — Z01818 Encounter for other preprocedural examination: Secondary | ICD-10-CM | POA: Diagnosis present

## 2021-09-20 DIAGNOSIS — G8929 Other chronic pain: Secondary | ICD-10-CM | POA: Insufficient documentation

## 2021-09-20 HISTORY — DX: Unspecified osteoarthritis, unspecified site: M19.90

## 2021-09-20 LAB — CBC WITH DIFFERENTIAL/PLATELET
Abs Immature Granulocytes: 0.01 10*3/uL (ref 0.00–0.07)
Basophils Absolute: 0 10*3/uL (ref 0.0–0.1)
Basophils Relative: 0 %
Eosinophils Absolute: 0.1 10*3/uL (ref 0.0–0.5)
Eosinophils Relative: 2 %
HCT: 39.1 % (ref 36.0–46.0)
Hemoglobin: 12.9 g/dL (ref 12.0–15.0)
Immature Granulocytes: 0 %
Lymphocytes Relative: 45 %
Lymphs Abs: 2.4 10*3/uL (ref 0.7–4.0)
MCH: 30.6 pg (ref 26.0–34.0)
MCHC: 33 g/dL (ref 30.0–36.0)
MCV: 92.7 fL (ref 80.0–100.0)
Monocytes Absolute: 0.4 10*3/uL (ref 0.1–1.0)
Monocytes Relative: 8 %
Neutro Abs: 2.4 10*3/uL (ref 1.7–7.7)
Neutrophils Relative %: 45 %
Platelets: 267 10*3/uL (ref 150–400)
RBC: 4.22 MIL/uL (ref 3.87–5.11)
RDW: 13.7 % (ref 11.5–15.5)
WBC: 5.4 10*3/uL (ref 4.0–10.5)
nRBC: 0 % (ref 0.0–0.2)

## 2021-09-20 LAB — COMPREHENSIVE METABOLIC PANEL
ALT: 21 U/L (ref 0–44)
AST: 21 U/L (ref 15–41)
Albumin: 4 g/dL (ref 3.5–5.0)
Alkaline Phosphatase: 81 U/L (ref 38–126)
Anion gap: 8 (ref 5–15)
BUN: 18 mg/dL (ref 8–23)
CO2: 25 mmol/L (ref 22–32)
Calcium: 9.2 mg/dL (ref 8.9–10.3)
Chloride: 107 mmol/L (ref 98–111)
Creatinine, Ser: 0.88 mg/dL (ref 0.44–1.00)
GFR, Estimated: >60 mL/min (ref >60)
Glucose, Bld: 90 mg/dL (ref 70–99)
Potassium: 4.2 mmol/L (ref 3.5–5.1)
Sodium: 140 mmol/L (ref 135–145)
Total Bilirubin: 0.8 mg/dL (ref 0.3–1.2)
Total Protein: 7.6 g/dL (ref 6.5–8.1)

## 2021-09-20 LAB — SURGICAL PCR SCREEN
MRSA, PCR: NEGATIVE
Staphylococcus aureus: NEGATIVE

## 2021-11-21 NOTE — Patient Instructions (Signed)
DUE TO COVID-19 ONLY TWO VISITORS  (aged 66 and older)  ARE ALLOWED TO COME WITH YOU AND STAY IN THE WAITING ROOM ONLY DURING PRE OP AND PROCEDURE.   **NO VISITORS ARE ALLOWED IN THE SHORT STAY AREA OR RECOVERY ROOM!!**  IF YOU WILL BE ADMITTED INTO THE HOSPITAL YOU ARE ALLOWED ONLY FOUR SUPPORT PEOPLE DURING VISITATION HOURS ONLY (7 AM -8PM)   The support person(s) must pass our screening, gel in and out, and wear a mask at all times, including in the patient's room. Patients must also wear a mask when staff or their support person are in the room. Visitors GUEST BADGE MUST BE WORN VISIBLY  One adult visitor may remain with you overnight and MUST be in the room by 8 P.M.     Your procedure is scheduled on: 12/05/21   Report to [facility] Main Entrance    Report to admitting at : 5:15 AM   Call this number if you have problems the morning of surgery [phone]   Do not eat food :After Midnight.   After Midnight you may have the following liquids until: 4:00 AM DAY OF SURGERY  Water Black Coffee (sugar ok, NO MILK/CREAM OR CREAMERS)  Tea (sugar ok, NO MILK/CREAM OR CREAMERS) regular and decaf                             Plain Jell-O (NO RED)                                           Fruit ices (not with fruit pulp, NO RED)                                     Popsicles (NO RED)                                                                  Juice: apple, WHITE grape, WHITE cranberry Sports drinks like Gatorade (NO RED)              Drink Ensure drink AT :  4:00 AM the day of surgery.     The day of surgery:  Drink ONE (1) Pre-Surgery Clear Ensure or G2 at AM the morning of surgery. Drink in one sitting. Do not sip.  This drink was given to you during your hospital  pre-op appointment visit. Nothing else to drink after completing the  Pre-Surgery Clear Ensure or G2.          If you have questions, please contact your surgeon's office.   Oral Hygiene is also  important to reduce your risk of infection.                                    Remember - BRUSH YOUR TEETH THE MORNING OF SURGERY WITH YOUR REGULAR TOOTHPASTE   Do NOT smoke after Midnight   Take these medicines the morning of surgery with A SIP  OF WATER: atenolol,amlodipine,omeprazole.  DO NOT TAKE ANY ORAL DIABETIC MEDICATIONS DAY OF YOUR SURGERY  Bring CPAP mask and tubing day of surgery.                              You may not have any metal on your body including hair pins, jewelry, and body piercing             Do not wear make-up, lotions, powders, perfumes/cologne, or deodorant  Do not wear nail polish including gel and S&S, artificial/acrylic nails, or any other type of covering on natural nails including finger and toenails. If you have artificial nails, gel coating, etc. that needs to be removed by a nail salon please have this removed prior to surgery or surgery may need to be canceled/ delayed if the surgeon/ anesthesia feels like they are unable to be safely monitored.   Do not shave  48 hours prior to surgery.    Do not bring valuables to the hospital. [facility] IS NOT             RESPONSIBLE   FOR VALUABLES.   Contacts, dentures or bridgework may not be worn into surgery.   Bring small overnight bag day of surgery.   DO NOT BRING YOUR HOME MEDICATIONS TO THE HOSPITAL. PHARMACY WILL DISPENSE MEDICATIONS LISTED ON YOUR MEDICATION LIST TO YOU DURING YOUR ADMISSION IN THE HOSPITAL!    Patients discharged on the day of surgery will not be allowed to drive home.  Someone NEEDS to stay with you for the first 24 hours after anesthesia.   Special Instructions: Bring a copy of your healthcare power of attorney and living will documents         the day of surgery if you haven't scanned them before.              Please read over the following fact sheets you were given: IF YOU HAVE QUESTIONS ABOUT YOUR PRE-OP INSTRUCTIONS PLEASE CALL [phone]     [facility] - Preparing  for Surgery Before surgery, you can play an important role.  Because skin is not sterile, your skin needs to be as free of germs as possible.  You can reduce the number of germs on your skin by washing with CHG (chlorahexidine gluconate) soap before surgery.  CHG is an antiseptic cleaner which kills germs and bonds with the skin to continue killing germs even after washing. Please DO NOT use if you have an allergy to CHG or antibacterial soaps.  If your skin becomes reddened/irritated stop using the CHG and inform your nurse when you arrive at Short Stay. Do not shave (including legs and underarms) for at least 48 hours prior to the first CHG shower.  You may shave your face/neck. Please follow these instructions carefully:  1.  Shower with CHG Soap the night before surgery and the  morning of Surgery.  2.  If you choose to wash your hair, wash your hair first as usual with your  normal  shampoo.  3.  After you shampoo, rinse your hair and body thoroughly to remove the  shampoo.                           4.  Use CHG as you would any other liquid soap.  You can apply chg directly  to the skin and wash  Gently with a scrungie or clean washcloth.  5.  Apply the CHG Soap to your body ONLY FROM THE NECK DOWN.   Do not use on face/ open                           Wound or open sores. Avoid contact with eyes, ears mouth and genitals (private parts).                       Wash face,  Genitals (private parts) with your normal soap.             6.  Wash thoroughly, paying special attention to the area where your surgery  will be performed.  7.  Thoroughly rinse your body with warm water from the neck down.  8.  DO NOT shower/wash with your normal soap after using and rinsing off  the CHG Soap.                9.  Pat yourself dry with a clean towel.            10.  Wear clean pajamas.            11.  Place clean sheets on your bed the night of your first shower and do not  sleep with  pets. Day of Surgery : Do not apply any lotions/deodorants the morning of surgery.  Please wear clean clothes to the hospital/surgery center.  FAILURE TO FOLLOW THESE INSTRUCTIONS MAY RESULT IN THE CANCELLATION OF YOUR SURGERY PATIENT SIGNATURE_________________________________  NURSE SIGNATURE__________________________________  ________________________________________________________________________   Adam Phenix  An incentive spirometer is a tool that can help keep your lungs clear and active. This tool measures how well you are filling your lungs with each breath. Taking long deep breaths may help reverse or decrease the chance of developing breathing (pulmonary) problems (especially infection) following: A long period of time when you are unable to move or be active. BEFORE THE PROCEDURE  If the spirometer includes an indicator to show your best effort, your nurse or respiratory therapist will set it to a desired goal. If possible, sit up straight or lean slightly forward. Try not to slouch. Hold the incentive spirometer in an upright position. INSTRUCTIONS FOR USE  Sit on the edge of your bed if possible, or sit up as far as you can in bed or on a chair. Hold the incentive spirometer in an upright position. Breathe out normally. Place the mouthpiece in your mouth and seal your lips tightly around it. Breathe in slowly and as deeply as possible, raising the piston or the ball toward the top of the column. Hold your breath for 3-5 seconds or for as long as possible. Allow the piston or ball to fall to the bottom of the column. Remove the mouthpiece from your mouth and breathe out normally. Rest for a few seconds and repeat Steps 1 through 7 at least 10 times every 1-2 hours when you are awake. Take your time and take a few normal breaths between deep breaths. The spirometer may include an indicator to show your best effort. Use the indicator as a goal to work toward during each  repetition. After each set of 10 deep breaths, practice coughing to be sure your lungs are clear. If you have an incision (the cut made at the time of surgery), support your incision when coughing by placing a pillow or rolled up towels  firmly against it. Once you are able to get out of bed, walk around indoors and cough well. You may stop using the incentive spirometer when instructed by your caregiver.  RISKS AND COMPLICATIONS Take your time so you do not get dizzy or light-headed. If you are in pain, you may need to take or ask for pain medication before doing incentive spirometry. It is harder to take a deep breath if you are having pain. AFTER USE Rest and breathe slowly and easily. It can be helpful to keep track of a log of your progress. Your caregiver can provide you with a simple table to help with this. If you are using the spirometer at home, follow these instructions: [place] IF:  You are having difficultly using the spirometer. You have trouble using the spirometer as often as instructed. Your pain medication is not giving enough relief while using the spirometer. You develop fever of 100.5 F (38.1 C) or higher. SEEK IMMEDIATE MEDICAL CARE IF:  You cough up bloody sputum that had not been present before. You develop fever of 102 F (38.9 C) or greater. You develop worsening pain at or near the incision site. MAKE SURE YOU:  Understand these instructions. Will watch your condition. Will get help right away if you are not doing well or get worse. Document Released: 07/03/2006 Document Revised: 05/15/2011 Document Reviewed: 09/03/2006 [facility] Patient Information 2014 [place], [place].   ________________________________________________________________________

## 2021-11-22 ENCOUNTER — Encounter (HOSPITAL_COMMUNITY): Payer: Self-pay

## 2021-11-22 ENCOUNTER — Other Ambulatory Visit: Payer: Self-pay

## 2021-11-22 ENCOUNTER — Encounter (HOSPITAL_COMMUNITY)
Admission: RE | Admit: 2021-11-22 | Discharge: 2021-11-22 | Disposition: A | Discharge status: Home / Self Care | Payer: No Typology Code available for payment source | Source: Ambulatory Visit | Attending: Orthopedic Surgery | Admitting: Orthopedic Surgery

## 2021-11-22 VITALS — BP 156/96 | HR 64 | Temp 97.5°F | Ht 71.0 in | Wt 276.0 lb

## 2021-11-22 DIAGNOSIS — I2510 Atherosclerotic heart disease of native coronary artery without angina pectoris: Secondary | ICD-10-CM | POA: Diagnosis not present

## 2021-11-22 DIAGNOSIS — Z01818 Encounter for other preprocedural examination: Secondary | ICD-10-CM

## 2021-11-22 DIAGNOSIS — Z01812 Encounter for preprocedural laboratory examination: Secondary | ICD-10-CM | POA: Insufficient documentation

## 2021-11-22 LAB — CBC
HCT: 39.9 % (ref 36.0–46.0)
Hemoglobin: 13.1 g/dL (ref 12.0–15.0)
MCH: 30.4 pg (ref 26.0–34.0)
MCHC: 32.8 g/dL (ref 30.0–36.0)
MCV: 92.6 fL (ref 80.0–100.0)
Platelets: 273 10*3/uL (ref 150–400)
RBC: 4.31 MIL/uL (ref 3.87–5.11)
RDW: 13.7 % (ref 11.5–15.5)
WBC: 5.2 10*3/uL (ref 4.0–10.5)
nRBC: 0 % (ref 0.0–0.2)

## 2021-11-22 LAB — BASIC METABOLIC PANEL
Anion gap: 8 (ref 5–15)
BUN: 17 mg/dL (ref 8–23)
CO2: 25 mmol/L (ref 22–32)
Calcium: 9 mg/dL (ref 8.9–10.3)
Chloride: 106 mmol/L (ref 98–111)
Creatinine, Ser: 0.81 mg/dL (ref 0.44–1.00)
GFR, Estimated: >60 mL/min (ref >60)
Glucose, Bld: 82 mg/dL (ref 70–99)
Potassium: 3.7 mmol/L (ref 3.5–5.1)
Sodium: 139 mmol/L (ref 135–145)

## 2021-11-22 LAB — SURGICAL PCR SCREEN
MRSA, PCR: NEGATIVE
Staphylococcus aureus: NEGATIVE

## 2021-11-22 NOTE — Progress Notes (Signed)
For Short Stay: [place] appointment date: Date of COVID positive in last 18 days:  Bowel Prep reminder:   For Anesthesia: PCP - DO: Sigurd Sos. LOV: 08/18/21 Cardiologist -   Chest x-ray -  EKG - 09/20/21 Stress Test -  ECHO -  Cardiac Cath -  Pacemaker/ICD device last checked: Pacemaker orders received: Device Rep notified:  Spinal Cord Stimulator:  Sleep Study -  CPAP -   Fasting Blood Sugar -  Checks Blood Sugar _____ times a day Date and result of last Hgb A1c-  Blood Thinner Instructions: Aspirin Instructions: Last Dose:  Activity level: Can go up a flight of stairs and activities of daily living without stopping and without chest pain and/or shortness of breath   Able to exercise without chest pain and/or shortness of breath   Unable to go up a flight of stairs without chest pain and/or shortness of breath     Anesthesia review: Hx: HTN  Patient denies shortness of breath, fever, cough and chest pain at PAT appointment   Patient verbalized understanding of instructions that were given to them at the PAT appointment. Patient was also instructed that they will need to review over the PAT instructions again at home before surgery.

## 2021-11-30 ENCOUNTER — Other Ambulatory Visit: Payer: Self-pay | Admitting: Orthopedic Surgery

## 2021-12-04 NOTE — Anesthesia Preprocedure Evaluation (Signed)
Anesthesia Evaluation  Patient identified by MRN, date of birth, ID band Patient awake    Reviewed: Allergy & Precautions, NPO status , Patient's Chart, lab work & pertinent test results, reviewed documented beta blocker date and time   History of Anesthesia Complications Negative for: history of anesthetic complications  Airway Mallampati: III  TM Distance: >3 FB Neck ROM: Full    Dental  (+) Dental Advisory Given, Chipped   Pulmonary neg pulmonary ROS,    Pulmonary exam normal        Cardiovascular hypertension, Pt. on medications and Pt. on home beta blockers Normal cardiovascular exam     Neuro/Psych  Headaches, negative psych ROS   GI/Hepatic Neg liver ROS, GERD  Medicated and Controlled,  Endo/Other   Obesity   Renal/GU negative Renal ROS     Musculoskeletal  (+) Arthritis ,   Abdominal   Peds  Hematology negative hematology ROS (+)   Anesthesia Other Findings   Reproductive/Obstetrics                            Anesthesia Physical Anesthesia Plan  ASA: 2  Anesthesia Plan: Spinal   Post-op Pain Management: Tylenol PO (pre-op)* and Regional block*   Induction:   PONV Risk Score and Plan: 2 and Treatment may vary due to age or medical condition and Propofol infusion  Airway Management Planned: Natural Airway and Simple Face Mask  Additional Equipment: None  Intra-op Plan:   Post-operative Plan:   Informed Consent: I have reviewed the patients History and Physical, chart, labs and discussed the procedure including the risks, benefits and alternatives for the proposed anesthesia with the patient or authorized representative who has indicated his/her understanding and acceptance.       Plan Discussed with: CRNA and Anesthesiologist  Anesthesia Plan Comments: (Labs reviewed, platelets acceptable. Discussed risks and benefits of spinal, including spinal/epidural  hematoma, infection, failed block, and PDPH. Patient expressed understanding and wished to proceed. )       Anesthesia Quick Evaluation

## 2021-12-05 ENCOUNTER — Ambulatory Visit (HOSPITAL_BASED_OUTPATIENT_CLINIC_OR_DEPARTMENT_OTHER): Payer: No Typology Code available for payment source | Admitting: Anesthesiology

## 2021-12-05 ENCOUNTER — Encounter (HOSPITAL_COMMUNITY): Payer: Self-pay | Admitting: Orthopedic Surgery

## 2021-12-05 ENCOUNTER — Encounter (HOSPITAL_COMMUNITY)
Admission: RE | Disposition: A | Discharge status: Home / Self Care | Payer: Self-pay | Source: Ambulatory Visit | Attending: Orthopedic Surgery

## 2021-12-05 ENCOUNTER — Ambulatory Visit (HOSPITAL_COMMUNITY): Payer: No Typology Code available for payment source | Admitting: Physician Assistant

## 2021-12-05 ENCOUNTER — Other Ambulatory Visit: Payer: Self-pay

## 2021-12-05 ENCOUNTER — Observation Stay (HOSPITAL_COMMUNITY)
Admission: RE | Admit: 2021-12-05 | Discharge: 2021-12-06 | Disposition: A | Discharge status: Home / Self Care | Payer: No Typology Code available for payment source | Source: Ambulatory Visit | Attending: Orthopedic Surgery | Admitting: Orthopedic Surgery

## 2021-12-05 DIAGNOSIS — I10 Essential (primary) hypertension: Secondary | ICD-10-CM | POA: Diagnosis not present

## 2021-12-05 DIAGNOSIS — Z79899 Other long term (current) drug therapy: Secondary | ICD-10-CM | POA: Diagnosis not present

## 2021-12-05 DIAGNOSIS — M1711 Unilateral primary osteoarthritis, right knee: Secondary | ICD-10-CM | POA: Diagnosis present

## 2021-12-05 DIAGNOSIS — M1712 Unilateral primary osteoarthritis, left knee: Secondary | ICD-10-CM | POA: Diagnosis not present

## 2021-12-05 DIAGNOSIS — Z96659 Presence of unspecified artificial knee joint: Secondary | ICD-10-CM

## 2021-12-05 DIAGNOSIS — Z01818 Encounter for other preprocedural examination: Secondary | ICD-10-CM

## 2021-12-05 HISTORY — PX: TOTAL KNEE ARTHROPLASTY: SHX125

## 2021-12-05 LAB — CBC
HCT: 38.1 % (ref 36.0–46.0)
Hemoglobin: 11.8 g/dL — ABNORMAL LOW (ref 12.0–15.0)
MCH: 29.9 pg (ref 26.0–34.0)
MCHC: 31 g/dL (ref 30.0–36.0)
MCV: 96.7 fL (ref 80.0–100.0)
Platelets: 114 10*3/uL — ABNORMAL LOW (ref 150–400)
RBC: 3.94 MIL/uL (ref 3.87–5.11)
RDW: 14.2 % (ref 11.5–15.5)
WBC: 5 10*3/uL (ref 4.0–10.5)
nRBC: 0 % (ref 0.0–0.2)

## 2021-12-05 SURGERY — ARTHROPLASTY, KNEE, TOTAL
Anesthesia: Spinal | Site: Knee | Laterality: Right

## 2021-12-05 MED ORDER — POVIDONE-IODINE 10 % EX SWAB
2.0000 | Freq: Once | CUTANEOUS | Status: AC
  Administered 2021-12-05: 2 via TOPICAL

## 2021-12-05 MED ORDER — PANTOPRAZOLE SODIUM 40 MG PO TBEC: 40.0000 mg | DELAYED_RELEASE_TABLET | Freq: Every day | ORAL | Status: DC

## 2021-12-05 MED ORDER — ONDANSETRON HCL 4 MG/2ML IJ SOLN: 4.0000 mg | Freq: Four times a day (QID) | INTRAMUSCULAR | Status: DC | PRN

## 2021-12-05 MED ORDER — CELECOXIB 200 MG PO CAPS: 200.0000 mg | ORAL_CAPSULE | Freq: Every day | ORAL | Status: DC | PRN

## 2021-12-05 MED ORDER — FENTANYL CITRATE (PF) 100 MCG/2ML IJ SOLN
INTRAMUSCULAR | Status: AC
  Filled 2021-12-05: qty 2

## 2021-12-05 MED ORDER — ZOLPIDEM TARTRATE 5 MG PO TABS: 5.0000 mg | ORAL_TABLET | Freq: Every evening | ORAL | Status: DC | PRN

## 2021-12-05 MED ORDER — SODIUM CHLORIDE 0.9 % IV SOLN
INTRAVENOUS | Status: DC | PRN
  Administered 2021-12-05: 70 mL

## 2021-12-05 MED ORDER — ALUM & MAG HYDROXIDE-SIMETH 200-200-20 MG/5ML PO SUSP: 30.0000 mL | ORAL | Status: DC | PRN

## 2021-12-05 MED ORDER — BUPIVACAINE LIPOSOME 1.3 % IJ SUSP: 20.0000 mL | Freq: Once | INTRAMUSCULAR | Status: DC

## 2021-12-05 MED ORDER — DEXAMETHASONE SODIUM PHOSPHATE 10 MG/ML IJ SOLN
INTRAMUSCULAR | Status: AC
  Filled 2021-12-05: qty 1

## 2021-12-05 MED ORDER — MENTHOL 3 MG MT LOZG: 1.0000 | LOZENGE | OROMUCOSAL | Status: DC | PRN

## 2021-12-05 MED ORDER — DIPHENHYDRAMINE HCL 12.5 MG/5ML PO ELIX: 12.5000 mg | ORAL_SOLUTION | ORAL | Status: DC | PRN

## 2021-12-05 MED ORDER — DOCUSATE SODIUM 100 MG PO CAPS: 100.0000 mg | ORAL_CAPSULE | Freq: Two times a day (BID) | ORAL | 0 refills | Status: AC

## 2021-12-05 MED ORDER — ACETAMINOPHEN 500 MG PO TABS
1000.0000 mg | ORAL_TABLET | Freq: Once | ORAL | Status: AC
  Administered 2021-12-05: 1000 mg via ORAL
  Filled 2021-12-05: qty 2

## 2021-12-05 MED ORDER — HYDROMORPHONE HCL 1 MG/ML IJ SOLN
INTRAMUSCULAR | Status: AC
  Filled 2021-12-05: qty 1

## 2021-12-05 MED ORDER — CEFAZOLIN IN SODIUM CHLORIDE 3-0.9 GM/100ML-% IV SOLN
3.0000 g | INTRAVENOUS | Status: AC
  Administered 2021-12-05: 3 g via INTRAVENOUS
  Filled 2021-12-05: qty 100

## 2021-12-05 MED ORDER — BUPIVACAINE LIPOSOME 1.3 % IJ SUSP
INTRAMUSCULAR | Status: AC
  Filled 2021-12-05: qty 20

## 2021-12-05 MED ORDER — PANTOPRAZOLE SODIUM 40 MG PO TBEC
40.0000 mg | DELAYED_RELEASE_TABLET | Freq: Every day | ORAL | Status: DC
  Administered 2021-12-06: 40 mg via ORAL
  Filled 2021-12-05: qty 1

## 2021-12-05 MED ORDER — HYDRALAZINE HCL 20 MG/ML IJ SOLN
10.0000 mg | Freq: Once | INTRAMUSCULAR | Status: AC
  Administered 2021-12-05: 10 mg via INTRAVENOUS

## 2021-12-05 MED ORDER — SENNOSIDES-DOCUSATE SODIUM 8.6-50 MG PO TABS: 1.0000 | ORAL_TABLET | Freq: Every evening | ORAL | Status: DC | PRN

## 2021-12-05 MED ORDER — DEXAMETHASONE SODIUM PHOSPHATE 10 MG/ML IJ SOLN
10.0000 mg | Freq: Once | INTRAMUSCULAR | Status: AC
  Administered 2021-12-06: 10 mg via INTRAVENOUS
  Filled 2021-12-05: qty 1

## 2021-12-05 MED ORDER — PHENYLEPHRINE HCL-NACL 20-0.9 MG/250ML-% IV SOLN
INTRAVENOUS | Status: AC
  Filled 2021-12-05: qty 250

## 2021-12-05 MED ORDER — TRANEXAMIC ACID-NACL 1000-0.7 MG/100ML-% IV SOLN
1000.0000 mg | INTRAVENOUS | Status: DC
  Filled 2021-12-05: qty 100

## 2021-12-05 MED ORDER — FUROSEMIDE 20 MG PO TABS: 20.0000 mg | ORAL_TABLET | Freq: Every day | ORAL | Status: DC | PRN

## 2021-12-05 MED ORDER — PHENOL 1.4 % MT LIQD: 1.0000 | OROMUCOSAL | Status: DC | PRN

## 2021-12-05 MED ORDER — MIDAZOLAM HCL 2 MG/2ML IJ SOLN
INTRAMUSCULAR | Status: AC
  Filled 2021-12-05: qty 2

## 2021-12-05 MED ORDER — ONDANSETRON HCL 4 MG/2ML IJ SOLN
INTRAMUSCULAR | Status: AC
  Filled 2021-12-05: qty 2

## 2021-12-05 MED ORDER — FLEET ENEMA 7-19 GM/118ML RE ENEM: 1.0000 | ENEMA | Freq: Once | RECTAL | Status: DC | PRN

## 2021-12-05 MED ORDER — METHOCARBAMOL 500 MG PO TABS: 500.0000 mg | ORAL_TABLET | Freq: Four times a day (QID) | ORAL | Status: DC | PRN

## 2021-12-05 MED ORDER — WATER FOR IRRIGATION, STERILE IR SOLN
Status: DC | PRN
  Administered 2021-12-05: 1000 mL

## 2021-12-05 MED ORDER — LACTATED RINGERS IV BOLUS
250.0000 mL | Freq: Once | INTRAVENOUS | Status: AC
  Administered 2021-12-05: 250 mL via INTRAVENOUS

## 2021-12-05 MED ORDER — METHOCARBAMOL 500 MG PO TABS: 500.0000 mg | ORAL_TABLET | Freq: Four times a day (QID) | ORAL | 0 refills | Status: DC

## 2021-12-05 MED ORDER — ONDANSETRON HCL 4 MG PO TABS: 4.0000 mg | ORAL_TABLET | Freq: Four times a day (QID) | ORAL | Status: DC | PRN

## 2021-12-05 MED ORDER — ROPIVACAINE HCL 7.5 MG/ML IJ SOLN
INTRAMUSCULAR | Status: DC | PRN
  Administered 2021-12-05: 20 mL via PERINEURAL

## 2021-12-05 MED ORDER — ORAL CARE MOUTH RINSE: 15.0000 mL | Freq: Once | OROMUCOSAL | Status: AC

## 2021-12-05 MED ORDER — OXYCODONE HCL 5 MG PO TABS
ORAL_TABLET | ORAL | Status: AC
  Filled 2021-12-05: qty 1

## 2021-12-05 MED ORDER — METOCLOPRAMIDE HCL 5 MG/ML IJ SOLN: 5.0000 mg | Freq: Three times a day (TID) | INTRAMUSCULAR | Status: DC | PRN

## 2021-12-05 MED ORDER — MIDAZOLAM HCL 5 MG/5ML IJ SOLN
INTRAMUSCULAR | Status: DC | PRN
  Administered 2021-12-05: 2 mg via INTRAVENOUS

## 2021-12-05 MED ORDER — ONDANSETRON 4 MG PO TBDP: 4.0000 mg | ORAL_TABLET | Freq: Three times a day (TID) | ORAL | 0 refills | Status: AC | PRN

## 2021-12-05 MED ORDER — PROPOFOL 1000 MG/100ML IV EMUL
INTRAVENOUS | Status: AC
  Filled 2021-12-05: qty 100

## 2021-12-05 MED ORDER — ATENOLOL 25 MG PO TABS
25.0000 mg | ORAL_TABLET | Freq: Every day | ORAL | Status: DC
  Administered 2021-12-06: 25 mg via ORAL
  Filled 2021-12-05: qty 1

## 2021-12-05 MED ORDER — SODIUM CHLORIDE 0.9 % IV SOLN: INTRAVENOUS | Status: DC

## 2021-12-05 MED ORDER — GABAPENTIN 300 MG PO CAPS
300.0000 mg | ORAL_CAPSULE | Freq: Once | ORAL | Status: AC
  Administered 2021-12-05: 300 mg via ORAL
  Filled 2021-12-05: qty 1

## 2021-12-05 MED ORDER — OXYCODONE HCL 5 MG PO TABS
5.0000 mg | ORAL_TABLET | Freq: Once | ORAL | Status: AC | PRN
  Administered 2021-12-05: 5 mg via ORAL

## 2021-12-05 MED ORDER — OXYCODONE HCL 5 MG PO TABS: 5.0000 mg | ORAL_TABLET | Freq: Four times a day (QID) | ORAL | 0 refills | Status: AC | PRN

## 2021-12-05 MED ORDER — FERROUS SULFATE 325 (65 FE) MG PO TABS
325.0000 mg | ORAL_TABLET | Freq: Three times a day (TID) | ORAL | Status: DC
  Administered 2021-12-05 – 2021-12-06 (×2): 325 mg via ORAL
  Filled 2021-12-05 (×2): qty 1

## 2021-12-05 MED ORDER — ASPIRIN 81 MG PO TBEC: 81.0000 mg | DELAYED_RELEASE_TABLET | Freq: Two times a day (BID) | ORAL | 0 refills | Status: AC

## 2021-12-05 MED ORDER — PROPOFOL 500 MG/50ML IV EMUL
INTRAVENOUS | Status: DC | PRN
  Administered 2021-12-05: 60 ug/kg/min via INTRAVENOUS

## 2021-12-05 MED ORDER — DOCUSATE SODIUM 100 MG PO CAPS
100.0000 mg | ORAL_CAPSULE | Freq: Two times a day (BID) | ORAL | Status: DC
  Administered 2021-12-05 – 2021-12-06 (×2): 100 mg via ORAL
  Filled 2021-12-05 (×2): qty 1

## 2021-12-05 MED ORDER — METOCLOPRAMIDE HCL 5 MG PO TABS: 5.0000 mg | ORAL_TABLET | Freq: Three times a day (TID) | ORAL | Status: DC | PRN

## 2021-12-05 MED ORDER — BUPIVACAINE IN DEXTROSE 0.75-8.25 % IT SOLN
INTRATHECAL | Status: DC | PRN
  Administered 2021-12-05: 1.8 mL via INTRATHECAL

## 2021-12-05 MED ORDER — SODIUM CHLORIDE (PF) 0.9 % IJ SOLN
INTRAMUSCULAR | Status: AC
  Filled 2021-12-05: qty 20

## 2021-12-05 MED ORDER — AMLODIPINE BESYLATE 5 MG PO TABS
2.5000 mg | ORAL_TABLET | Freq: Every day | ORAL | Status: DC
  Administered 2021-12-06: 2.5 mg via ORAL
  Filled 2021-12-05: qty 1

## 2021-12-05 MED ORDER — BUPIVACAINE-EPINEPHRINE (PF) 0.25% -1:200000 IJ SOLN
INTRAMUSCULAR | Status: AC
  Filled 2021-12-05: qty 30

## 2021-12-05 MED ORDER — OXYCODONE HCL 5 MG PO TABS
5.0000 mg | ORAL_TABLET | ORAL | Status: DC | PRN
  Administered 2021-12-05 – 2021-12-06 (×4): 10 mg via ORAL
  Filled 2021-12-05 (×4): qty 2

## 2021-12-05 MED ORDER — HYDROMORPHONE HCL 1 MG/ML IJ SOLN
0.5000 mg | INTRAMUSCULAR | Status: DC | PRN
  Administered 2021-12-05 (×2): 1 mg via INTRAVENOUS
  Filled 2021-12-05: qty 1

## 2021-12-05 MED ORDER — OXYCODONE HCL 5 MG/5ML PO SOLN: 5.0000 mg | Freq: Once | ORAL | Status: AC | PRN

## 2021-12-05 MED ORDER — FENTANYL CITRATE PF 50 MCG/ML IJ SOSY: 25.0000 ug | PREFILLED_SYRINGE | INTRAMUSCULAR | Status: DC | PRN

## 2021-12-05 MED ORDER — ASPIRIN 81 MG PO CHEW
81.0000 mg | CHEWABLE_TABLET | Freq: Two times a day (BID) | ORAL | Status: DC
  Administered 2021-12-06: 81 mg via ORAL
  Filled 2021-12-05: qty 1

## 2021-12-05 MED ORDER — ONDANSETRON HCL 4 MG/2ML IJ SOLN
INTRAMUSCULAR | Status: DC | PRN
  Administered 2021-12-05: 4 mg via INTRAVENOUS

## 2021-12-05 MED ORDER — ONDANSETRON HCL 4 MG/2ML IJ SOLN
4.0000 mg | Freq: Once | INTRAMUSCULAR | Status: AC | PRN
  Administered 2021-12-05: 4 mg via INTRAVENOUS

## 2021-12-05 MED ORDER — CHLORHEXIDINE GLUCONATE 0.12 % MT SOLN
15.0000 mL | Freq: Once | OROMUCOSAL | Status: AC
  Administered 2021-12-05: 15 mL via OROMUCOSAL

## 2021-12-05 MED ORDER — DEXAMETHASONE SODIUM PHOSPHATE 10 MG/ML IJ SOLN
8.0000 mg | Freq: Once | INTRAMUSCULAR | Status: AC
  Administered 2021-12-05: 8 mg via INTRAVENOUS

## 2021-12-05 MED ORDER — HYDRALAZINE HCL 20 MG/ML IJ SOLN
INTRAMUSCULAR | Status: AC
  Filled 2021-12-05: qty 1

## 2021-12-05 MED ORDER — PHENYLEPHRINE HCL-NACL 20-0.9 MG/250ML-% IV SOLN
INTRAVENOUS | Status: DC | PRN
  Administered 2021-12-05: 50 ug/min via INTRAVENOUS

## 2021-12-05 MED ORDER — METHOCARBAMOL 500 MG IVPB - SIMPLE MED
INTRAVENOUS | Status: AC
  Filled 2021-12-05: qty 55

## 2021-12-05 MED ORDER — FENTANYL CITRATE (PF) 100 MCG/2ML IJ SOLN
INTRAMUSCULAR | Status: DC | PRN
  Administered 2021-12-05 (×2): 50 ug via INTRAVENOUS

## 2021-12-05 MED ORDER — BISACODYL 5 MG PO TBEC: 5.0000 mg | DELAYED_RELEASE_TABLET | Freq: Every day | ORAL | Status: DC | PRN

## 2021-12-05 MED ORDER — ACETAMINOPHEN 500 MG PO TABS
1000.0000 mg | ORAL_TABLET | Freq: Four times a day (QID) | ORAL | Status: AC
  Administered 2021-12-05 – 2021-12-06 (×4): 1000 mg via ORAL
  Filled 2021-12-05 (×4): qty 2

## 2021-12-05 MED ORDER — SODIUM CHLORIDE 0.9 % IR SOLN
Status: DC | PRN
  Administered 2021-12-05: 500 mL

## 2021-12-05 MED ORDER — LACTATED RINGERS IV SOLN: INTRAVENOUS | Status: DC

## 2021-12-05 MED ORDER — PROPOFOL 10 MG/ML IV BOLUS
INTRAVENOUS | Status: DC | PRN
  Administered 2021-12-05: 10 mg via INTRAVENOUS

## 2021-12-05 MED ORDER — LACTATED RINGERS IV BOLUS
500.0000 mL | Freq: Once | INTRAVENOUS | Status: AC
  Administered 2021-12-05: 500 mL via INTRAVENOUS

## 2021-12-05 MED ORDER — METHOCARBAMOL 500 MG IVPB - SIMPLE MED
500.0000 mg | Freq: Four times a day (QID) | INTRAVENOUS | Status: DC | PRN
  Administered 2021-12-05: 500 mg via INTRAVENOUS

## 2021-12-05 SURGICAL SUPPLY — 57 items
ARTISURF 10M PLYR 10-11EF KNEE (Knees) IMPLANT
BAG COUNTER SPONGE SURGICOUNT (BAG) IMPLANT
BAG SPEC THK2 15X12 ZIP CLS (MISCELLANEOUS) ×1
BAG SPNG CNTER NS LX DISP (BAG)
BAG ZIPLOCK 12X15 (MISCELLANEOUS) ×2 IMPLANT
BLADE SAGITTAL 13X1.27X60 (BLADE) ×2 IMPLANT
BLADE SAW SGTL 18X1.27X75 (BLADE) ×2 IMPLANT
BLADE SURG 15 STRL LF DISP TIS (BLADE) ×2 IMPLANT
BLADE SURG 15 STRL SS (BLADE) ×1
BLADE SURG SZ10 CARB STEEL (BLADE) ×4 IMPLANT
BNDG ELASTIC 6X5.8 VLCR STR LF (GAUZE/BANDAGES/DRESSINGS) ×2 IMPLANT
BOWL SMART MIX CTS (DISPOSABLE) ×2 IMPLANT
BSPLAT TIB 5D E CMNT KN RT (Knees) ×1 IMPLANT
CEMENT BONE R 1X40 (Cement) ×4 IMPLANT
COVER SURGICAL LIGHT HANDLE (MISCELLANEOUS) ×2 IMPLANT
CUFF TOURN SGL QUICK 34 (TOURNIQUET CUFF) ×1
CUFF TRNQT CYL 34X4.125X (TOURNIQUET CUFF) ×2 IMPLANT
DRAPE INCISE IOBAN 66X45 STRL (DRAPES) ×4 IMPLANT
DRAPE U-SHAPE 47X51 STRL (DRAPES) ×2 IMPLANT
DRSG AQUACEL AG ADV 3.5X10 (GAUZE/BANDAGES/DRESSINGS) ×2 IMPLANT
DURAPREP 26ML APPLICATOR (WOUND CARE) ×4 IMPLANT
ELECT REM PT RETURN 15FT ADLT (MISCELLANEOUS) ×2 IMPLANT
FEMUR CMT CCR STD SZ11 RIGHT (Joint) ×1 IMPLANT
FEMUR CMTD CCR STD SZ11 RIGHT (Joint) IMPLANT
GLOVE BIOGEL PI IND STRL 7.5 (GLOVE) ×2 IMPLANT
GLOVE BIOGEL PI IND STRL 8.5 (GLOVE) ×4 IMPLANT
GLOVE SURG LX STRL 7.5 STRW (GLOVE) ×2 IMPLANT
GLOVE SURG LX STRL 8.0 MICRO (GLOVE) ×4 IMPLANT
GLOVE SURG ORTHO 8.0 STRL STRW (GLOVE) ×4 IMPLANT
GOWN STRL REUS W/ TWL XL LVL3 (GOWN DISPOSABLE) ×4 IMPLANT
GOWN STRL REUS W/TWL XL LVL3 (GOWN DISPOSABLE) ×2
HANDPIECE INTERPULSE COAX TIP (DISPOSABLE) ×1
HOLDER FOLEY CATH W/STRAP (MISCELLANEOUS) ×2 IMPLANT
HOOD PEEL AWAY FLYTE STAYCOOL (MISCELLANEOUS) ×6 IMPLANT
KIT TURNOVER KIT A (KITS) IMPLANT
MANIFOLD NEPTUNE II (INSTRUMENTS) ×2 IMPLANT
NDL HYPO 21X1.5 SAFETY (NEEDLE) ×2 IMPLANT
NEEDLE HYPO 21X1.5 SAFETY (NEEDLE) ×1 IMPLANT
NS IRRIG 1000ML POUR BTL (IV SOLUTION) ×2 IMPLANT
PACK TOTAL KNEE CUSTOM (KITS) ×2 IMPLANT
PROTECTOR NERVE ULNAR (MISCELLANEOUS) ×2 IMPLANT
SET HNDPC FAN SPRY TIP SCT (DISPOSABLE) ×2 IMPLANT
SPIKE FLUID TRANSFER (MISCELLANEOUS) ×4 IMPLANT
STEM POLY PAT PLY 35M KNEE (Knees) IMPLANT
STEM TIBIA 5 DEG SZ E R KNEE (Knees) IMPLANT
STRIP CLOSURE SKIN 1/2X4 (GAUZE/BANDAGES/DRESSINGS) ×2 IMPLANT
SUT BONE WAX W31G (SUTURE) ×2 IMPLANT
SUT MNCRL AB 3-0 PS2 18 (SUTURE) ×2 IMPLANT
SUT STRATAFIX 0 PDS 27 VIOLET (SUTURE) ×1
SUT STRATAFIX 1PDS 45CM VIOLET (SUTURE) ×2 IMPLANT
SUT VIC AB 1 CT1 36 (SUTURE) ×2 IMPLANT
SUTURE STRATFX 0 PDS 27 VIOLET (SUTURE) ×2 IMPLANT
SYR 30ML LL (SYRINGE) ×4 IMPLANT
TIBIA STEM 5 DEG SZ E R KNEE (Knees) ×1 IMPLANT
TRAY FOLEY MTR SLVR 16FR STAT (SET/KITS/TRAYS/PACK) ×2 IMPLANT
WATER STERILE IRR 1000ML POUR (IV SOLUTION) ×4 IMPLANT
WRAP KNEE MAXI GEL POST OP (GAUZE/BANDAGES/DRESSINGS) ×2 IMPLANT

## 2021-12-05 NOTE — Plan of Care (Signed)

## 2021-12-05 NOTE — Progress Notes (Signed)
Orthopedic Tech Progress Note Patient Details:  Cassie Horne 02/20/1956 [id number] Bone Foam was delivered to patient in PACU  Ortho Devices Type of Ortho Device: Bone foam zero knee Ortho Device/Splint Interventions: Ordered      Cassie Horne Cassie Horne 12/05/2021, 9:31 AM

## 2021-12-05 NOTE — Anesthesia Postprocedure Evaluation (Signed)
Anesthesia Post Note  Patient: Cassie Horne  Procedure(s) Performed: TOTAL KNEE ARTHROPLASTY (Right: Knee)     Patient location during evaluation: PACU Anesthesia Type: Spinal Level of consciousness: awake and alert Pain management: pain level controlled Vital Signs Assessment: post-procedure vital signs reviewed and stable Respiratory status: spontaneous breathing and respiratory function stable Cardiovascular status: blood pressure returned to baseline and stable Postop Assessment: spinal receding and no apparent nausea or vomiting Anesthetic complications: no   No notable events documented.  Last Vitals:  Vitals:   12/05/21 0945 12/05/21 1000  BP: (!) 149/95 (!) 162/97  Pulse: 66 64  Resp: 16 16  Temp:  (!) 36 C  SpO2: 100% 96%    Last Pain:  Vitals:   12/05/21 1000  TempSrc:   PainSc: [place]

## 2021-12-05 NOTE — Progress Notes (Signed)
Physical Therapy Treatment Patient Details Name: Cassie Horne MRN: [id number] DOB: 21-Jan-1956 Today's Date: 12/05/2021   History of Present Illness Pt is a 66yo female presenting s/p R-TKA on 12/05/21 . PMH: GERD, HTN.    PT Comments    Pt seen for second visit POD0 as pt is requesting to transfer to [facility] to void. Required min assist for bed mobility, min guard for transfers with RW, no overt LOB noted or physical assist required. Pt elected to defer ambulation as dinner had arrived and preferred to eat. Emphasized importance of completing ankle pumps and using incentive spirometer, pt verbalized understanding. We will continue to follow acutely.    Recommendations for follow up therapy are one component of a multi-disciplinary discharge planning process, led by the attending physician.  Recommendations may be updated based on patient status, additional functional criteria and insurance authorization.  Follow Up Recommendations  Follow physician's recommendations for discharge plan and follow up therapies     Assistance Recommended at Discharge Intermittent Supervision/Assistance  Patient can return home with the following A little help with walking and/or transfers;A little help with bathing/dressing/bathroom;Assistance with cooking/housework;Assist for transportation;Help with stairs or ramp for entrance   Equipment Recommendations  None recommended by PT    Recommendations for Other Services       Precautions / Restrictions Precautions Precautions: Knee Precaution Booklet Issued: No Precaution Comments: no pillow behind the knee Restrictions Weight Bearing Restrictions: Yes RLE Weight Bearing: Weight bearing as tolerated Other Position/Activity Restrictions: wbat     Mobility  Bed Mobility Overal bed mobility: Needs Assistance Bed Mobility: Supine to Sit, Sit to Supine     Supine to sit: Min guard Sit to supine: Min assist   General bed mobility comments: Min guard  for supine to sit, min assist to bring BLE into bed for sit to supine    Transfers Overall transfer level: Needs assistance Equipment used: Rolling walker (2 wheels) Transfers: Sit to/from Stand, Bed to chair/wheelchair/BSC Sit to Stand: Min guard   Step pivot transfers: Min guard       General transfer comment: Pt required min guard for safety, VCs for sequencing and powering up through BUE. Step pivot: pt min guard, VCs for sequencing, no physical assist or overt LOB noted.    Ambulation/Gait               General Gait Details: deferred, pt elected to eat dinner   Stairs             Wheelchair Mobility    Modified Rankin (Stroke Patients Only)       Balance Overall balance assessment: Needs assistance Sitting-balance support: Feet supported, No upper extremity supported Sitting balance-Leahy Scale: Good     Standing balance support: During functional activity, Reliant on assistive device for balance, Bilateral upper extremity supported Standing balance-Leahy Scale: Poor                              Cognition Arousal/Alertness: Awake/alert Behavior During Therapy: WFL for tasks assessed/performed Overall Cognitive Status: Within Functional Limits for tasks assessed                                          Exercises Total Joint Exercises Ankle Circles/Pumps: AROM, Both, 10 reps    General Comments  Pertinent Vitals/Pain Pain Assessment Pain Assessment: 0-10 Pain Score: 7  Pain Location: right knee Pain Descriptors / Indicators: Operative site guarding, Burning Pain Intervention(s): Limited activity within patient's tolerance, Monitored during session, Repositioned, Ice applied    Home Living Family/patient expects to be discharged to:: Private residence Living Arrangements: Alone Available Help at Discharge: Family;Available 24 hours/day Type of Home: House Home Access: Level entry       Home  Layout: One level Home Equipment: Shower seat - built Medical sales representative (2 wheels)      Prior Function            PT Goals (current goals can now be found in the care plan section) Acute Rehab PT Goals Patient Stated Goal: Reduce pain PT Goal Formulation: With patient Time For Goal Achievement: 12/12/21 Potential to Achieve Goals: Good Progress towards PT goals: Progressing toward goals    Frequency    7X/week      PT Plan Current plan remains appropriate    Co-evaluation              AM-PAC PT "6 Clicks" Mobility   Outcome Measure  Help needed turning from your back to your side while in a flat bed without using bedrails?: None Help needed moving from lying on your back to sitting on the side of a flat bed without using bedrails?: A Little Help needed moving to and from a bed to a chair (including a wheelchair)?: A Little Help needed standing up from a chair using your arms (e.g., wheelchair or bedside chair)?: A Little Help needed to walk in hospital room?: A Little Help needed climbing 3-5 steps with a railing? : A Lot 6 Click Score: 18    End of Session Equipment Utilized During Treatment: Gait belt Activity Tolerance: Patient tolerated treatment well;No increased pain Patient left: in bed;with call bell/phone within reach;with family/visitor present;with nursing/sitter in room Nurse Communication: Mobility status PT Visit Diagnosis: Pain;Difficulty in walking, not elsewhere classified (R26.2) Pain - Right/Left: Right Pain - part of body: Knee     Time: 8469-6295 PT Time Calculation (min) (ACUTE ONLY): 13 min  Charges:  $Therapeutic Activity: 8-22 mins                    Coolidge Breeze, PT, DPT [facility] Department Office: [phone] Weekend pager: [phone]  Coolidge Breeze 12/05/2021, 6:54 PM

## 2021-12-05 NOTE — Op Note (Signed)
TOTAL KNEE REPLACEMENT OPERATIVE NOTE:  12/05/2021  12:19 PM  PATIENT:  Cassie Horne  66 y.o. female  PRE-OPERATIVE DIAGNOSIS:  Osteoarthritis of right knee M17.12  POST-OPERATIVE DIAGNOSIS:  Osteoarthritis of right knee M17.12  PROCEDURE:  Procedure(s): TOTAL KNEE ARTHROPLASTY  SURGEON:  Surgeon(s): Vickey Huger, MD  PHYSICIAN ASSISTANT: Carlyon Shadow, PA-C   ANESTHESIA:   spinal  SPECIMEN: None  COUNTS:  Correct  TOURNIQUET:   Total Tourniquet Time Documented: Thigh (Right) - 39 minutes Total: Thigh (Right) - 39 minutes   DICTATION:  Indication for procedure:    The patient is a 66 y.o. female who has failed conservative treatment for Osteoarthritis of right knee M17.12.  Informed consent was obtained prior to anesthesia. The risks versus benefits of the operation were explain and in a way the patient can, and did, understand.     Description of procedure:     The patient was taken to the operating room and placed under anesthesia.  The patient was positioned in the usual fashion taking care that all body parts were adequately padded and/or protected.  A tourniquet was applied and the leg prepped and draped in the usual sterile fashion.  The extremity was exsanguinated with the esmarch and tourniquet inflated to 300 mmHg.  Pre-operative range of motion was normal.    A midline incision approximately 6-7 inches long was made with a #10 blade.  A new blade was used to make a parapatellar arthrotomy going 2-3 cm into the quadriceps tendon, over the patella, and alongside the medial aspect of the patellar tendon.  A synovectomy was then performed with the #10 blade and forceps. I then elevated the deep MCL off the medial tibial metaphysis subperiosteally around to the semimembranosus attachment.    I everted the patella and used calipers to measure patellar thickness.  I used the reamer to ream down to appropriate thickness to recreate the native thickness.  I then  removed excess bone with the rongeur and sagittal saw.  I used the appropriately sized template and drilled the three lug holes.  I then put the trial in place and measured the thickness with the calipers to ensure recreation of the native thickness.  The trial was then removed and the patella subluxed and the knee brought into flexion.  A homan retractor was place to retract and protect the patella and lateral structures.  A Z-retractor was place medially to protect the medial structures.  The extra-medullary alignment system was used to make cut the tibial articular surface perpendicular to the anamotic axis of the tibia and in 3 degrees of posterior slope.  The cut surface and alignment jig was removed.    I then marked out the epicondylar axis on the distal femur.   I then used the anterior referencing sizer and measured the femur to be a size  11 .  The 4-In-1 cutting block was screwed into place in external rotation matching the posterior condylar angle, making our cuts perpendicular to the epicondylar axis.  Anterior, posterior and chamfer cuts were made with the sagittal saw.  The cutting block and cut pieces were removed.  A lamina spreader was placed in 90 degrees of flexion.  The ACL, PCL, menisci, and posterior condylar osteophytes were removed.  A 10 mm spacer blocked was found to offer good flexion and extension gap balance after minimal in degree releasing.   The scoop retractor was then placed and the femoral finishing block was pinned in place.  The  small sagittal saw was used as well as the lug drill to finish the femur.  The block and cut surfaces were removed and the medullary canal hole filled with autograft bone from the cut pieces.  The tibia was delivered forward in deep flexion and external rotation.  A size E tray was selected and pinned into place centered on the medial 1/3 of the tibial tubercle.  The reamer and keel was used to prepare the tibia through the tray.    I then  trialed with the size  11  femur, size E tibia, a 10 mm insert and the 35 patella.  I had excellent flexion/extension gap balance, excellent patella tracking.  Flexion was full and beyond 120 degrees; extension was zero.  These components were chosen and the staff opened them to me on the back table while the knee was lavaged copiously and the cement mixed.  The soft tissue was infiltrated with 60cc of exparel 1.3% through a 21 gauge needle.  I cemented in the components and removed all excess cement.  The polyethylene tibial component was snapped into place and the knee placed in extension while cement was hardening.  The capsule was infilltrated with a 60cc exparel/marcaine/saline mixture.   Once the cement was hard, the tourniquet was let down.  Hemostasis was obtained.  The arthrotomy was closed using a #1 stratofix running suture.  The deep soft tissues were closed with #0 vicryls and the subcuticular layer closed with #2-0 vicryl.  The skin was reapproximated and closed with 3.0 Monocryl.  The wound was covered with steristrips, aquacel dressing, and a TED stocking.   The patient was then awakened, extubated, and taken to the recovery room in stable condition.  BLOOD LOSS:  300cc COMPLICATIONS:  None.  PLAN OF CARE: Discharge to home after PACU  PATIENT DISPOSITION:  PACU - hemodynamically stable.   Please fax a copy of this op note to my office at [phone] (please only include page 1 and 2 of the Case Information op note)

## 2021-12-05 NOTE — Transfer of Care (Signed)
Immediate Anesthesia Transfer of Care Note  Patient: Cassie Horne  Procedure(s) Performed: TOTAL KNEE ARTHROPLASTY (Right: Knee)  Patient Location: PACU  Anesthesia Type:Spinal  Level of Consciousness: sedated  Airway & Oxygen Therapy: Patient Spontanous Breathing and Patient connected to face mask oxygen  Post-op Assessment: Report given to RN and Post -op Vital signs reviewed and stable  Post vital signs: Reviewed and stable  Last Vitals:  Vitals Value Taken Time  BP 132/77 12/05/21 0853  Temp    Pulse 72 12/05/21 0855  Resp 21 12/05/21 0855  SpO2 100 % 12/05/21 0855  Vitals shown include unvalidated device data.  Last Pain:  Vitals:   12/05/21 0602  TempSrc: Oral         Complications: No notable events documented.

## 2021-12-05 NOTE — Anesthesia Procedure Notes (Signed)
Spinal  Patient location during procedure: OR Start time: 12/05/2021 7:21 AM End time: 12/05/2021 7:25 AM Reason for block: surgical anesthesia Staffing Performed: anesthesiologist  Anesthesiologist: Audry Pili, MD Performed by: Audry Pili, MD Authorized by: Audry Pili, MD   Preanesthetic Checklist Completed: patient identified, IV checked, risks and benefits discussed, surgical consent, monitors and equipment checked, pre-op evaluation and timeout performed Spinal Block Patient position: sitting Prep: DuraPrep Patient monitoring: heart rate, cardiac monitor, continuous pulse ox and blood pressure Approach: midline Location: L3-4 Injection technique: single-shot Needle Needle type: Pencan  Needle gauge: 24 G Additional Notes Consent was obtained prior to the procedure with all questions answered and concerns addressed. Risks including, but not limited to, bleeding, infection, nerve damage, paralysis, failed block, inadequate analgesia, allergic reaction, high spinal, itching, and headache were discussed and the patient wished to proceed. Functioning IV was confirmed and monitors were applied. Sterile prep and drape, including hand hygiene, mask, and sterile gloves were used. The patient was positioned and the spine was prepped. The skin was anesthetized with lidocaine. Free flow of clear CSF was obtained prior to injecting local anesthetic into the CSF. The spinal needle aspirated freely following injection. The needle was carefully withdrawn. The patient tolerated the procedure well.   Renold Don, MD

## 2021-12-05 NOTE — Anesthesia Procedure Notes (Addendum)
Anesthesia Regional Block: Adductor canal block   Pre-Anesthetic Checklist: , timeout performed,  Correct Patient, Correct Site, Correct Laterality,  Correct Procedure, Correct Position, site marked,  Risks and benefits discussed,  Surgical consent,  Pre-op evaluation,  At surgeon's request and post-op pain management  Laterality: Right  Prep: chloraprep       Needles:  Injection technique: Single-shot  Needle Type: Echogenic Needle     Needle Length: 10cm  Needle Gauge: 21     Additional Needles:   Narrative:  Start time: 12/05/2021 6:57 AM End time: 12/05/2021 7:01 AM Injection made incrementally with aspirations every 5 mL.  Performed by: Personally  Anesthesiologist: Audry Pili, MD  Additional Notes: No pain on injection. No increased resistance to injection. Injection made in 5cc increments. Good needle visualization. Patient tolerated the procedure well.

## 2021-12-05 NOTE — Progress Notes (Signed)
Orthopedic Tech Progress Note Patient Details:  Cassie Horne 1955/03/16 [id number]  CPM Right Knee CPM Right Knee: On Right Knee Flexion (Degrees): 60 Right Knee Extension (Degrees): 0  Post Interventions Patient Tolerated: Well  Cassie Horne 12/05/2021, 8:04 PM

## 2021-12-05 NOTE — Evaluation (Addendum)
Physical Therapy Evaluation Patient Details Name: Cassie Horne MRN: [id number] DOB: [birth year] Today's Date: 12/05/2021  History of Present Illness  Pt is a 66yo female presenting s/p R-TKA on 12/05/21 . PMH: GERD, HTN.  Clinical Impression  Cassie Horne is a 66 y.o. female POD 0 s/p R-TKA. Patient reports independence with mobility at baseline. Pt's BP in supine prior to session 173/96, HR67, pt reporting this is a high reading for her. Patient is now limited by functional impairments (see PT problem list below) and requires min assist for bed mobility, further mobility deferred secondary to urinary incontinence and presence of RLE extensor lag, OOB unsafe at present.  Patient instructed in exercise to facilitate ROM and circulation to manage edema. Patient will benefit from continued skilled PT interventions to address impairments and progress towards PLOF. Acute PT will follow to progress mobility and stair training in preparation for safe discharge home.     Recommendations for follow up therapy are one component of a multi-disciplinary discharge planning process, led by the attending physician.  Recommendations may be updated based on patient status, additional functional criteria and insurance authorization.  Follow Up Recommendations Follow physician's recommendations for discharge plan and follow up therapies      Assistance Recommended at Discharge Intermittent Supervision/Assistance  Patient can return home with the following  A little help with walking and/or transfers;A little help with bathing/dressing/bathroom;Assistance with cooking/housework;Assist for transportation;Help with stairs or ramp for entrance    Equipment Recommendations None recommended by PT  Recommendations for Other Services       Functional Status Assessment Patient has had a recent decline in their functional status and demonstrates the ability to make significant improvements in function in a  reasonable and predictable amount of time.     Precautions / Restrictions Precautions Precautions: Knee Precaution Booklet Issued: No Precaution Comments: no pillow behind the knee Restrictions Weight Bearing Restrictions: No Other Position/Activity Restrictions: wbat      Mobility  Bed Mobility Overal bed mobility: Needs Assistance Bed Mobility: Supine to Sit, Sit to Supine     Supine to sit: Min guard Sit to supine: Min assist   General bed mobility comments: Educated pt on use of gait belt to self-assist RLE, min assit to bring RLE back on stretcher after sitting EOB. During sitting, pt incontinent of urine, assisted pt with cleanup, further mobility deferred.    Transfers                   General transfer comment: unsafe    Ambulation/Gait               General Gait Details: unsafe  Stairs            Wheelchair Mobility    Modified Rankin (Stroke Patients Only)       Balance                                             Pertinent Vitals/Pain Pain Assessment Pain Assessment: 0-10 Pain Score: 7  Pain Location: right knee Pain Descriptors / Indicators: Operative site guarding, Burning Pain Intervention(s): Limited activity within patient's tolerance, Monitored during session, Repositioned, Ice applied    Home Living Family/patient expects to be discharged to:: Private residence Living Arrangements: Alone Available Help at Discharge: Family;Available 24 hours/day Type of Home: House Home Access: Level entry  Home Layout: One level Home Equipment: Shower seat - built Medical sales representative (2 wheels)      Prior Function Prior Level of Function : Independent/Modified Independent;Working/employed;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)             Mobility Comments: ind ADLs Comments: ind     Journalist, newspaper        Extremity/Trunk Assessment   Upper Extremity  Assessment Upper Extremity Assessment: Overall WFL for tasks assessed    Lower Extremity Assessment Lower Extremity Assessment: RLE deficits/detail;LLE deficits/detail RLE Deficits / Details: MMT ank DF 3+/5, PF 4/5, extensor lag noted (pt unable to lift ankle off bed) RLE Sensation: WNL LLE Deficits / Details: MMT ank DF 3+/5, PF 4/5 LLE Sensation: WNL    Cervical / Trunk Assessment Cervical / Trunk Assessment: Normal  Communication   Communication: No difficulties  Cognition Arousal/Alertness: Awake/alert Behavior During Therapy: WFL for tasks assessed/performed Overall Cognitive Status: Within Functional Limits for tasks assessed                                          General Comments General comments (skin integrity, edema, etc.): Son Leroy Sea present    Exercises Total Joint Exercises Ankle Circles/Pumps: AROM, Both, 10 reps   Assessment/Plan    PT Assessment Patient needs continued PT services  PT Problem List Decreased strength;Decreased range of motion;Decreased activity tolerance;Decreased balance;Decreased mobility;Decreased coordination;Pain       PT Treatment Interventions DME instruction;Gait training;Stair training;Functional mobility training;Therapeutic activities;Therapeutic exercise;Balance training;Neuromuscular re-education;Patient/family education    PT Goals (Current goals can be found in the Care Plan section)  Acute Rehab PT Goals Patient Stated Goal: Reduce pain PT Goal Formulation: With patient Time For Goal Achievement: 12/12/21 Potential to Achieve Goals: Good    Frequency 7X/week     Co-evaluation               AM-PAC PT "6 Clicks" Mobility  Outcome Measure Help needed turning from your back to your side while in a flat bed without using bedrails?: A Little Help needed moving from lying on your back to sitting on the side of a flat bed without using bedrails?: A Little Help needed moving to and from a bed to a  chair (including a wheelchair)?: A Little Help needed standing up from a chair using your arms (e.g., wheelchair or bedside chair)?: A Little Help needed to walk in hospital room?: A Little Help needed climbing 3-5 steps with a railing? : A Lot 6 Click Score: 17    End of Session   Activity Tolerance: Other (comment) (pt has not cleared anesthesia) Patient left: in bed;with call bell/phone within reach;with family/visitor present;with nursing/sitter in room Nurse Communication: Mobility status PT Visit Diagnosis: Pain;Difficulty in walking, not elsewhere classified (R26.2) Pain - Right/Left: Right Pain - part of body: Knee    Time: 3474-2595 PT Time Calculation (min) (ACUTE ONLY): 23 min   Charges:   PT Evaluation $PT Eval Low Complexity: [place], PT, DPT [facility] Office: [phone] Weekend pager: [phone]  Coolidge Breeze 12/05/2021, 1:25 PM

## 2021-12-05 NOTE — H&P (Signed)
COREN CROWNOVER MRN:  [id number] DOB/SEX:  [birth year]/female  CHIEF COMPLAINT:  Painful right Knee  HISTORY: Patient is a 66 y.o. female presented with a history of pain in the right knee. Onset of symptoms was gradual starting a few years ago with gradually worsening course since that time. Patient has been treated conservatively with over-the-counter NSAIDs and activity modification. Patient currently rates pain in the knee at 10 out of 10 with activity. There is pain at night.  PAST MEDICAL HISTORY: Patient Active Problem List   Diagnosis Date Noted   HX, URINARY INFECTION 09/18/2006   HYPERTENSION 06/18/2006   HEADACHE 06/18/2006   PAP SMEAR, ABNORMAL 06/18/2006   Past Medical History:  Diagnosis Date   Arthritis    GERD (gastroesophageal reflux disease)    Hypertension    Past Surgical History:  Procedure Laterality Date   CHOLECYSTECTOMY     TUBAL LIGATION       MEDICATIONS:   Medications Prior to Admission  Medication Sig Dispense Refill Last Dose   amLODipine (NORVASC) 2.5 MG tablet Take 2.5 mg by mouth daily.   12/05/2021 at 0330   atenolol (TENORMIN) 25 MG tablet Take 25 mg by mouth daily.     12/05/2021 at 0300   celecoxib (CELEBREX) 200 MG capsule Take 200 mg by mouth daily as needed for mild pain.   12/03/2021   furosemide (LASIX) 20 MG tablet Take 20 mg by mouth daily as needed for fluid or edema.   Past Month   Menthol, Topical Analgesic, (BENGAY EX) Apply 1 Application topically daily as needed (pain).   12/04/2021   omeprazole (PRILOSEC) 40 MG capsule Take 40 mg by mouth daily.     12/05/2021 at 0300   Vitamin D, Ergocalciferol, (DRISDOL) 1.25 MG (50000 UNIT) CAPS capsule Take 50,000 Units by mouth every Monday.   Past Week   vitamin E (VITAMIN E) 400 UNIT capsule Take 400 Units by mouth daily.   Past Week    ALLERGIES:   Allergies  Allergen Reactions   Codeine Swelling   Morphine And Related Other (See Comments)    bradycardia    REVIEW OF SYSTEMS:  A  comprehensive review of systems was negative except for: Musculoskeletal: positive for arthralgias and bone pain   FAMILY HISTORY:  History reviewed. No pertinent family history.  SOCIAL HISTORY:   Social History   Tobacco Use   Smoking status: Never   Smokeless tobacco: Never  Substance Use Topics   Alcohol use: No     EXAMINATION:  Vital signs in last 24 hours: Temp:  [97.7 F (36.5 C)] 97.7 F (36.5 C) (10/02 0602) Pulse Rate:  [73] 73 (10/02 0602) Resp:  [16] 16 (10/02 0602) BP: (141)/(70) 141/70 (10/02 0602) SpO2:  [100 %] 100 % (10/02 0602) Weight:  [125.2 kg] 125.2 kg (10/02 0533)  BP (!) 141/70   Pulse 73   Temp 97.7 F (36.5 C) (Oral)   Resp 16   Ht 5\' 11"  (1.803 m)   Wt 125.2 kg   SpO2 100%   BMI 38.49 kg/m   General Appearance:    Alert, cooperative, no distress, appears stated age  Head:    Normocephalic, without obvious abnormality, atraumatic  Eyes:    PERRL, conjunctiva/corneas clear, EOM's intact, fundi    benign, both eyes  Ears:    Normal TM's and external ear canals, both ears  Nose:   Nares normal, septum midline, mucosa normal, no drainage    or sinus tenderness  Throat:   Lips, mucosa, and tongue normal; teeth and gums normal  Neck:   Supple, symmetrical, trachea midline, no adenopathy;    thyroid:  no enlargement/tenderness/nodules; no carotid   bruit or JVD  Back:     Symmetric, no curvature, ROM normal, no CVA tenderness  Lungs:     Clear to auscultation bilaterally, respirations unlabored  Chest Wall:    No tenderness or deformity   Heart:    Regular rate and rhythm, S1 and S2 normal, no murmur, rub   or gallop  Breast Exam:    No tenderness, masses, or nipple abnormality  Abdomen:     Soft, non-tender, bowel sounds active all four quadrants,    no masses, no organomegaly  Genitalia:    Normal female without lesion, discharge or tenderness  Rectal:    Normal tone, normal prostate, no masses or tenderness;   guaiac negative stool   Extremities:   Extremities normal, atraumatic, no cyanosis or edema  Pulses:   2+ and symmetric all extremities  Skin:   Skin color, texture, turgor normal, no rashes or lesions  Lymph nodes:   Cervical, supraclavicular, and axillary nodes normal  Neurologic:   CNII-XII intact, normal strength, sensation and reflexes    throughout    Musculoskeletal:  ROM 0-120, Ligaments intact,  Imaging Review Plain radiographs demonstrate severe degenerative joint disease of the right knee. The overall alignment is neutral. The bone quality appears to be good for age and reported activity level.  Assessment/Plan: Primary osteoarthritis, right knee   The patient history, physical examination and imaging studies are consistent with advanced degenerative joint disease of the right knee. The patient has failed conservative treatment.  The clearance notes were reviewed.  After discussion with the patient it was felt that Total Knee Replacement was indicated. The procedure,  risks, and benefits of total knee arthroplasty were presented and reviewed. The risks including but not limited to aseptic loosening, infection, blood clots, vascular injury, stiffness, patella tracking problems complications among others were discussed. The patient acknowledged the explanation, agreed to proceed with the plan.  Preoperative templating of the joint replacement has been completed, documented, and submitted to the Operating Room personnel in order to optimize intra-operative equipment management.    Patient's anticipated LOS is less than 2 midnights, meeting these requirements: - Lives within 1 hour of care - Has a competent adult at home to recover with post-op recover - NO history of  - Chronic pain requiring opiods  - Diabetes  - Coronary Artery Disease  - Heart failure  - Heart attack  - Stroke  - DVT/VTE  - Cardiac arrhythmia  - Respiratory Failure/COPD  - Renal failure  - Anemia  - Advanced Liver disease      Donia Ast 12/05/2021, 6:56 AM

## 2021-12-06 ENCOUNTER — Encounter (HOSPITAL_COMMUNITY): Payer: Self-pay | Admitting: Orthopedic Surgery

## 2021-12-06 DIAGNOSIS — M1711 Unilateral primary osteoarthritis, right knee: Secondary | ICD-10-CM | POA: Diagnosis not present

## 2021-12-06 NOTE — Progress Notes (Signed)
Physical Therapy Treatment Patient Details Name: Cassie Horne MRN: [id number] DOB: November 09, 1955 Today's Date: 12/06/2021   History of Present Illness Pt is a 66yo female presenting s/p R-TKA on 12/05/21 . PMH: GERD, HTN.    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  She plans to return to work in about 10 weeks.  Assisted OOB required increased time.  General bed mobility comments: demonstarted and instructed how to use a belt to self assist LE.  General transfer comment: 25% VC's on proper sequencing and safety with turns. General Gait Details: 25% VC's on proper sequencing as well as proper walker to self distance. Then returned to room to perform some TE's following HEP handout.  Instructed on proper tech, freq as well as use of ICE.   Pt will need another PT session to complete HEP and ensure safe activity tolerance.   Recommendations for follow up therapy are one component of a multi-disciplinary discharge planning process, led by the attending physician.  Recommendations may be updated based on patient status, additional functional criteria and insurance authorization.  Follow Up Recommendations  Follow physician's recommendations for discharge plan and follow up therapies     Assistance Recommended at Discharge Intermittent Supervision/Assistance  Patient can return home with the following A little help with walking and/or transfers;A little help with bathing/dressing/bathroom;Assistance with cooking/housework;Assist for transportation;Help with stairs or ramp for entrance   Equipment Recommendations  None recommended by PT    Recommendations for Other Services       Precautions / Restrictions Precautions Precautions: Knee Precaution Comments: no pillow behind the knee Restrictions Weight Bearing Restrictions: No RLE Weight Bearing: Weight bearing as tolerated     Mobility  Bed  Mobility Overal bed mobility: Needs Assistance Bed Mobility: Supine to Sit     Supine to sit: Min guard     General bed mobility comments: demonstarted and instructed how to use a belt to self assist LE    Transfers Overall transfer level: Needs assistance Equipment used: Rolling walker (2 wheels) Transfers: Sit to/from Stand Sit to Stand: Supervision, Min guard           General transfer comment: 25% VC's on proper sequencing and safety with turns.    Ambulation/Gait Ambulation/Gait assistance: Supervision, Min guard Gait Distance (Feet): 42 Feet Assistive device: Rolling walker (2 wheels) Gait Pattern/deviations: Step-to pattern Gait velocity: decreased     General Gait Details: 25% VC's on proper sequencing as well as proper walker to self distance.   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  She plans to return to work in about 10  weeks.        Exercises   Total Knee Replacement TE's following HEP handout 10 reps B LE ankle pumps 05 reps towel squeezes 05 reps knee presses 05 reps heel slides  05 reps SAQ's 05 reps SLR's 05 reps ABD Educated on use of gait belt to assist with TE's Followed by ICE    General Comments        Pertinent Vitals/Pain Pain Assessment Pain Assessment: 0-10 Pain Score: 6  Pain Location: right knee Pain Descriptors / Indicators: Operative site guarding, Burning, Tender, Tightness Pain Intervention(s): Monitored during session, Premedicated before session, Repositioned, Ice applied    Home  Living                          Prior Function            PT Goals (current goals can now be found in the care plan section) Progress towards PT goals: Progressing toward goals    Frequency    7X/week      PT Plan Current plan remains appropriate    Co-evaluation              AM-PAC PT "6 Clicks" Mobility   Outcome Measure  Help needed turning from your back to your side while in a flat bed without using bedrails?: A Little Help needed moving from lying on your back to sitting on the side of a flat bed without using bedrails?: A Little Help needed moving to and from a bed to a chair (including a wheelchair)?: A Little Help needed standing up from a chair using your arms (e.g., wheelchair or bedside chair)?: A Little Help needed to walk in hospital room?: A Little Help needed climbing 3-5 steps with a railing? : A Little 6 Click Score: 18    End of Session Equipment Utilized During Treatment: Gait belt Activity Tolerance: Patient tolerated treatment well;No increased pain Patient left: in chair;with call bell/phone within reach;with chair alarm set Nurse Communication: Mobility status PT Visit Diagnosis: Pain;Difficulty in walking, not elsewhere classified (R26.2) Pain - Right/Left: Right Pain - part of body: Knee     Time: 1005-1030 PT Time Calculation (min) (ACUTE ONLY): 25 min  Charges:  $Gait Training: 8-22 mins $Therapeutic Exercise: 8-22 mins                    Rica Koyanagi  PTA [place] [facility] M-F          [phone] Weekend pager [phone]

## 2021-12-06 NOTE — Progress Notes (Signed)
Physical Therapy Treatment Patient Details Name: Cassie Horne MRN: [id number] DOB: 11/06/1955 Today's Date: 12/06/2021   History of Present Illness Pt is a 66yo female presenting s/p R-TKA on 12/05/21 . PMH: GERD, HTN.    PT Comments    POD # 1 pm session Pt feeling better.  Amb to bathroom with NT earlier.  Tolerated an increased distance this afternoon. Completed the seated TE's following HEP handout.  Addressed all mobility questions, discussed appropriate activity, educated on use of ICE.  Pt ready for D/C to home.   Recommendations for follow up therapy are one component of a multi-disciplinary discharge planning process, led by the attending physician.  Recommendations may be updated based on patient status, additional functional criteria and insurance authorization.  Follow Up Recommendations  Follow physician's recommendations for discharge plan and follow up therapies     Assistance Recommended at Discharge Intermittent Supervision/Assistance  Patient can return home with the following A little help with walking and/or transfers;A little help with bathing/dressing/bathroom;Assistance with cooking/housework;Assist for transportation;Help with stairs or ramp for entrance   Equipment Recommendations  None recommended by PT    Recommendations for Other Services       Precautions / Restrictions Precautions Precautions: Knee Precaution Comments: no pillow behind the knee Restrictions Weight Bearing Restrictions: No RLE Weight Bearing: Weight bearing as tolerated     Mobility  Bed Mobility Overal bed mobility: Needs Assistance Bed Mobility: Supine to Sit     Supine to sit: Min guard     General bed mobility comments: OOB in recliner    Transfers Overall transfer level: Needs assistance Equipment used: Rolling walker (2 wheels) Transfers: Sit to/from Stand Sit to Stand: Supervision   Step pivot transfers: Supervision       General transfer comment: 25%  VC's on proper sequencing and safety with turns.    Ambulation/Gait Ambulation/Gait assistance: Supervision Gait Distance (Feet): 52 Feet Assistive device: Rolling walker (2 wheels) Gait Pattern/deviations: Step-to pattern Gait velocity: decreased     General Gait Details: <25% VC's on proper sequencing as well as proper walker to self distance.   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  She plans to return to work in about 10 weeks.        Exercises  05 reps all seated TE's    General Comments        Pertinent Vitals/Pain Pain Assessment Pain Assessment: 0-10 Pain Score: 7  Pain Location: right knee Pain Descriptors / Indicators: Operative site guarding, Burning, Tender, Tightness Pain Intervention(s): Monitored during session, Repositioned, Ice applied, Patient requesting pain meds-RN notified    Home Living  Prior Function            PT Goals (current goals can now be found in the care plan section) Progress towards PT goals: Progressing toward goals    Frequency    7X/week      PT Plan Current plan remains appropriate    Co-evaluation              AM-PAC PT "6 Clicks" Mobility   Outcome Measure  Help needed turning from your back to your side while in a flat bed without using bedrails?: A Little Help needed moving from lying on your back to sitting on the side of a flat bed without using bedrails?: A Little Help needed moving to and from a bed to a chair (including a  wheelchair)?: A Little Help needed standing up from a chair using your arms (e.g., wheelchair or bedside chair)?: A Little Help needed to walk in hospital room?: A Little Help needed climbing 3-5 steps with a railing? : A Little 6 Click Score: 18    End of Session Equipment Utilized During Treatment: Gait belt Activity Tolerance: Patient tolerated treatment well;No increased pain Patient left: in chair;with call bell/phone within reach;with chair alarm set Nurse Communication: Mobility status PT Visit Diagnosis: Pain;Difficulty in walking, not elsewhere classified (R26.2) Pain - Right/Left: Right Pain - part of body: Knee     Time: 1340-1405 PT Time Calculation (min) (ACUTE ONLY): 25 min  Charges:  $Gait Training: 8-22 mins $Therapeutic Exercise: 8-22 mins                     {Jakorian Marengo  PTA Acute  [facility]habilitation [facility] Office M-F          [phone] Weekend pager [phone]

## 2021-12-06 NOTE — Progress Notes (Signed)
SPORTS MEDICINE AND JOINT REPLACEMENT  Cassie Spurling, MD    Laurier Nancy, PA-C [address] [place], [place], [place]  [address]                             [phone]   PROGRESS NOTE  Subjective:  negative for Chest Pain  negative for Shortness of Breath  negative for Nausea/Vomiting   negative for Calf Pain  negative for Bowel Movement   Tolerating Diet: yes         Patient reports pain as 4 on 0-10 scale.    Objective: Vital signs in last 24 hours:   Patient Vitals for the past 24 hrs:  BP Temp Temp src Pulse Resp SpO2  12/06/21 0940 (!) 166/74 97.6 F (36.4 C) -- 67 18 99 %  12/06/21 0358 (!) 133/58 97.8 F (36.6 C) Oral 85 16 100 %  12/06/21 0001 (!) 142/72 (!) 97.5 F (36.4 C) Oral 86 16 98 %  12/05/21 2018 (!) 165/74 98.4 F (36.9 C) Oral 93 16 95 %  12/05/21 1725 (!) 116/53 (!) 97.5 F (36.4 C) Oral 79 16 100 %  12/05/21 1620 (!) 148/76 -- -- 79 14 97 %  12/05/21 1610 (!) 150/83 -- -- 86 -- 96 %  12/05/21 1543 (!) 186/99 -- -- -- -- --  12/05/21 1500 (!) 176/101 -- -- 71 -- 95 %  12/05/21 1400 (!) 158/88 -- -- 69 -- 96 %  12/05/21 1300 (!) 178/91 -- -- 67 -- 100 %    @flow {1959:LAST@   Intake/Output from previous day:   10/02 0701 - 10/03 0700 In: 2815.7 [P.O.:360; I.V.:1900.7] Out: 1980 [Urine:1950]   Intake/Output this shift:   10/03 0701 - 10/03 1900 In: 120 [P.O.:120] Out: -    Intake/Output      10/02 0701 10/03 0700 10/03 0701 10/04 0700   P.O. 360 120   I.V. (mL/kg) 1900.7 (15.2)    IV Piggyback 555    Total Intake(mL/kg) 2815.7 (22.5) 120 (1)   Urine (mL/kg/hr) 1950 (0.6)    Blood 30    Total Output 1980    Net +835.7 +120        Urine Occurrence 3 x 1 x      LABORATORY DATA: Recent Labs    12/05/21 0927  WBC 5.0  HGB 11.8*  HCT 38.1  PLT 114*   No results for input(s): "NA", "K", "CL", "CO2", "BUN", "CREATININE", "GLUCOSE", "CALCIUM" in the last 168 hours. No results found for: "INR", "PROTIME"  Examination:  General  appearance: alert, cooperative, and no distress Extremities: extremities normal, atraumatic, no cyanosis or edema  Wound Exam: clean, dry, intact   Drainage:  None: wound tissue dry  Motor Exam: Quadriceps and Hamstrings Intact  Sensory Exam: Superficial Peroneal, Deep Peroneal, and Tibial normal   Assessment:    1 Day Post-Op  Procedure(s) (LRB): TOTAL KNEE ARTHROPLASTY (Right)  ADDITIONAL DIAGNOSIS:  Principal Problem:   S/P total knee replacement     Plan: Physical Therapy as ordered Weight Bearing as Tolerated (WBAT)  DVT Prophylaxis:  Aspirin  DISCHARGE PLAN: Home  Patient doing well and ready for D/C home today       Patient's anticipated LOS is less than 2 midnights, meeting these requirements: - Lives within 1 hour of care - Has a competent adult at home to recover with post-op recover - NO history of  - Chronic pain requiring opiods  -  Diabetes  - Coronary Artery Disease  - Heart failure  - Heart attack  - Stroke  - DVT/VTE  - Cardiac arrhythmia  - Respiratory Failure/COPD  - Renal failure  - Anemia  - Advanced Liver disease      Donia Ast 12/06/2021, 12:50 PM

## 2021-12-06 NOTE — TOC Transition Note (Signed)
Transition of Care [facility]) - CM/SW Discharge Note   Patient Details  Name: Cassie Horne MRN: [id number] Date of Birth: 1955-05-12  [facility] CM/SW Contact:  Lennart Pall, LCSW Phone Number: 12/06/2021, 10:40 AM   Clinical Narrative:    Met with pt and confirming she has needed DME at home.  OPPT already set up with [facility].  No TOC needs.   Final next level of care: OP Rehab Barriers to Discharge: No Barriers Identified   Patient Goals and CMS Choice Patient states their goals for this hospitalization and ongoing recovery are:: return home      Discharge Placement                       Discharge Plan and Services                DME Arranged: N/A DME Agency: NA                  Social Determinants of Health (SDOH) Interventions     Readmission Risk Interventions     No data to display

## 2021-12-06 NOTE — Discharge Summary (Signed)
SPORTS MEDICINE & JOINT REPLACEMENT   Georgena Spurling, MD   Laurier Nancy, PA-C [address] [place], [place], [place]  [address]                             [phone]  PATIENT ID: ALVEENA TAIRA        MRN:  [id number]          DOB/AGE: 1956/01/27 / 66 y.o.    DISCHARGE SUMMARY  ADMISSION DATE:    12/05/2021 DISCHARGE DATE:   12/06/2021   ADMISSION DIAGNOSIS: S/P total knee replacement [Z96.659]    DISCHARGE DIAGNOSIS:  Osteoarthritis of right knee M17.12    ADDITIONAL DIAGNOSIS: Principal Problem:   S/P total knee replacement  Past Medical History:  Diagnosis Date   Arthritis    GERD (gastroesophageal reflux disease)    Hypertension     PROCEDURE: Procedure(s): TOTAL KNEE ARTHROPLASTY on 12/05/2021  CONSULTS:    HISTORY:  See H&P in chart  HOSPITAL COURSE:  KISMET FACEMIRE is a 66 y.o. admitted on 12/05/2021 and found to have a diagnosis of Osteoarthritis of right knee M17.12.  After appropriate laboratory studies were obtained  they were taken to the operating room on 12/05/2021 and underwent Procedure(s): TOTAL KNEE ARTHROPLASTY.   They were given perioperative antibiotics:  Anti-infectives (From admission, onward)    Start     Dose/Rate Route Frequency Ordered Stop   12/05/21 0600  ceFAZolin (ANCEF) IVPB 3g/100 mL premix        3 g 200 mL/hr over 30 Minutes Intravenous On call to O.R. 12/05/21 8315 12/05/21 0727     .  Patient given tranexamic acid IV or topical and exparel intra-operatively.  Tolerated the procedure well.    POD# 1: Vital signs were stable.  Patient denied Chest pain, shortness of breath, or calf pain.  Patient was started on Aspirin twice daily at 8am.  Consults to PT, OT, and care management were made.  The patient was weight bearing as tolerated.  CPM was placed on the operative leg 0-90 degrees for 6-8 hours a day. When out of the CPM, patient was placed in the foam block to achieve full extension. Incentive spirometry was taught.  Dressing  was changed.       POD #2, Continued  PT for ambulation and exercise program.  IV saline locked.  O2 discontinued.    The remainder of the hospital course was dedicated to ambulation and strengthening.   The patient was discharged on 1 Day Post-Op in  Good condition.  Blood products given:none  DIAGNOSTIC STUDIES: Recent vital signs: Patient Vitals for the past 24 hrs:  BP Temp Temp src Pulse Resp SpO2  12/06/21 0940 (!) 166/74 97.6 F (36.4 C) -- 67 18 99 %  12/06/21 0358 (!) 133/58 97.8 F (36.6 C) Oral 85 16 100 %  12/06/21 0001 (!) 142/72 (!) 97.5 F (36.4 C) Oral 86 16 98 %  12/05/21 2018 (!) 165/74 98.4 F (36.9 C) Oral 93 16 95 %  12/05/21 1725 (!) 116/53 (!) 97.5 F (36.4 C) Oral 79 16 100 %  12/05/21 1620 (!) 148/76 -- -- 79 14 97 %  12/05/21 1610 (!) 150/83 -- -- 86 -- 96 %  12/05/21 1543 (!) 186/99 -- -- -- -- --  12/05/21 1500 (!) 176/101 -- -- 71 -- 95 %  12/05/21 1400 (!) 158/88 -- -- 69 -- 96 %  12/05/21 1300 (!)  178/91 -- -- 67 -- 100 %       Recent laboratory studies: Recent Labs    12/05/21 0927  WBC 5.0  HGB 11.8*  HCT 38.1  PLT 114*   No results for input(s): "NA", "K", "CL", "CO2", "BUN", "CREATININE", "GLUCOSE", "CALCIUM" in the last 168 hours. No results found for: "INR", "PROTIME"   Recent Radiographic Studies :  No results found.  DISCHARGE INSTRUCTIONS: Discharge Instructions     Call MD / Call 911   Complete by: As directed    If you experience chest pain or shortness of breath, CALL 911 and be transported to the hospital emergency room.  If you develope a fever above 101 F, pus (white drainage) or increased drainage or redness at the wound, or calf pain, call your surgeon's office.   Call MD / Call 911   Complete by: As directed    If you experience chest pain or shortness of breath, CALL 911 and be transported to the hospital emergency room.  If you develope a fever above 101 F, pus (white drainage) or increased drainage or redness at  the wound, or calf pain, call your surgeon's office.   Constipation Prevention   Complete by: As directed    Drink plenty of fluids.  Prune juice may be helpful.  You may use a stool softener, such as Colace (over the counter) 100 mg twice a day.  Use MiraLax (over the counter) for constipation as needed.   Constipation Prevention   Complete by: As directed    Drink plenty of fluids.  Prune juice may be helpful.  You may use a stool softener, such as Colace (over the counter) 100 mg twice a day.  Use MiraLax (over the counter) for constipation as needed.   Diet - low sodium heart healthy   Complete by: As directed    Diet - low sodium heart healthy   Complete by: As directed    Discharge instructions   Complete by: As directed    INSTRUCTIONS AFTER JOINT REPLACEMENT   Remove items at home which could result in a fall. This includes throw rugs or furniture in walking pathways ICE to the affected joint every three hours while awake for 30 minutes at a time, for at least the first 3-5 days, and then as needed for pain and swelling.  Continue to use ice for pain and swelling. You may notice swelling that will progress down to the foot and ankle.  This is normal after surgery.  Elevate your leg when you are not up walking on it.   Continue to use the breathing machine you got in the hospital (incentive spirometer) which will help keep your temperature down.  It is common for your temperature to cycle up and down following surgery, especially at night when you are not up moving around and exerting yourself.  The breathing machine keeps your lungs expanded and your temperature down.   DIET:  As you were doing prior to hospitalization, we recommend a well-balanced diet.  DRESSING / WOUND CARE / SHOWERING  Keep the surgical dressing until follow up.  The dressing is water proof, so you can shower without any extra covering.  IF THE DRESSING FALLS OFF or the wound gets wet inside, change the dressing  with sterile gauze.  Please use good hand washing techniques before changing the dressing.  Do not use any lotions or creams on the incision until instructed by your surgeon.    ACTIVITY  Increase activity slowly as tolerated, but follow the weight bearing instructions below.   No driving for 6 weeks or until further direction given by your physician.  You cannot drive while taking narcotics.  No lifting or carrying greater than 10 lbs. until further directed by your surgeon. Avoid periods of inactivity such as sitting longer than an hour when not asleep. This helps prevent blood clots.  You may return to work once you are authorized by your doctor.     WEIGHT BEARING   Weight bearing as tolerated with assist device (walker, cane, etc) as directed, use it as long as suggested by your surgeon or therapist, typically at least 4-6 weeks.   EXERCISES  Results after joint replacement surgery are often greatly improved when you follow the exercise, range of motion and muscle strengthening exercises prescribed by your doctor. Safety measures are also important to protect the joint from further injury. Any time any of these exercises cause you to have increased pain or swelling, decrease what you are doing until you are comfortable again and then slowly increase them. If you have problems or questions, call your caregiver or physical therapist for advice.   Rehabilitation is important following a joint replacement. After just a few days of immobilization, the muscles of the leg can become weakened and shrink (atrophy).  These exercises are designed to build up the tone and strength of the thigh and leg muscles and to improve motion. Often times heat used for twenty to thirty minutes before working out will loosen up your tissues and help with improving the range of motion but do not use heat for the first two weeks following surgery (sometimes heat can increase post-operative swelling).   These  exercises can be done on a training (exercise) mat, on the floor, on a table or on a bed. Use whatever works the best and is most comfortable for you.    Use music or television while you are exercising so that the exercises are a pleasant break in your day. This will make your life better with the exercises acting as a break in your routine that you can look forward to.   Perform all exercises about fifteen times, three times per day or as directed.  You should exercise both the operative leg and the other leg as well.  Exercises include:   Quad Sets - Tighten up the muscle on the front of the thigh (Quad) and hold for 5-10 seconds.   Straight Leg Raises - With your knee straight (if you were given a brace, keep it on), lift the leg to 60 degrees, hold for 3 seconds, and slowly lower the leg.  Perform this exercise against resistance later as your leg gets stronger.  Leg Slides: Lying on your back, slowly slide your foot toward your buttocks, bending your knee up off the floor (only go as far as is comfortable). Then slowly slide your foot back down until your leg is flat on the floor again.  Angel Wings: Lying on your back spread your legs to the side as far apart as you can without causing discomfort.  Hamstring Strength:  Lying on your back, push your heel against the floor with your leg straight by tightening up the muscles of your buttocks.  Repeat, but this time bend your knee to a comfortable angle, and push your heel against the floor.  You may put a pillow under the heel to make it more comfortable if necessary.   A  rehabilitation program following joint replacement surgery can speed recovery and prevent re-injury in the future due to weakened muscles. Contact your doctor or a physical therapist for more information on knee rehabilitation.    CONSTIPATION  Constipation is defined medically as fewer than three stools per week and severe constipation as less than one stool per week.  Even if  you have a regular bowel pattern at home, your normal regimen is likely to be disrupted due to multiple reasons following surgery.  Combination of anesthesia, postoperative narcotics, change in appetite and fluid intake all can affect your bowels.   YOU MUST use at least one of the following options; they are listed in order of increasing strength to get the job done.  They are all available over the counter, and you may need to use some, POSSIBLY even all of these options:    Drink plenty of fluids (prune juice may be helpful) and high fiber foods Colace 100 mg by mouth twice a day  Senokot for constipation as directed and as needed Dulcolax (bisacodyl), take with full glass of water  Miralax (polyethylene glycol) once or twice a day as needed.  If you have tried all these things and are unable to have a bowel movement in the first 3-4 days after surgery call either your surgeon or your primary doctor.    If you experience loose stools or diarrhea, hold the medications until you stool forms back up.  If your symptoms do not get better within 1 week or if they get worse, check with your doctor.  If you experience "the worst abdominal pain ever" or develop nausea or vomiting, please contact the office immediately for further recommendations for treatment.   ITCHING:  If you experience itching with your medications, try taking only a single pain pill, or even half a pain pill at a time.  You can also use Benadryl over the counter for itching or also to help with sleep.   TED HOSE STOCKINGS:  Use stockings on both legs until for at least 2 weeks or as directed by physician office. They may be removed at night for sleeping.  MEDICATIONS:  See your medication summary on the "After Visit Summary" that nursing will review with you.  You may have some home medications which will be placed on hold until you complete the course of blood thinner medication.  It is important for you to complete the blood  thinner medication as prescribed.  PRECAUTIONS:  If you experience chest pain or shortness of breath - call 911 immediately for transfer to the hospital emergency department.   If you develop a fever greater that 101 F, purulent drainage from wound, increased redness or drainage from wound, foul odor from the wound/dressing, or calf pain - CONTACT YOUR SURGEON.                                                   FOLLOW-UP APPOINTMENTS:  If you do not already have a post-op appointment, please call the office for an appointment to be seen by your surgeon.  Guidelines for how soon to be seen are listed in your "After Visit Summary", but are typically between 1-4 weeks after surgery.  OTHER INSTRUCTIONS:   Knee Replacement:  Do not place pillow under knee, focus on keeping the knee straight while resting. CPM  instructions: 0-90 degrees, 2 hours in the morning, 2 hours in the afternoon, and 2 hours in the evening. Place foam block, curve side up under heel at all times except when in CPM or when walking.  DO NOT modify, tear, cut, or change the foam block in any way.  POST-OPERATIVE OPIOID TAPER INSTRUCTIONS: It is important to wean off of your opioid medication as soon as possible. If you do not need pain medication after your surgery it is ok to stop day one. Opioids include: Codeine, Hydrocodone(Norco, Vicodin), Oxycodone(Percocet, oxycontin) and hydromorphone amongst others.  Long term and even short term use of opiods can cause: Increased pain response Dependence Constipation Depression Respiratory depression And more.  Withdrawal symptoms can include Flu like symptoms Nausea, vomiting And more Techniques to manage these symptoms Hydrate well Eat regular healthy meals Stay active Use relaxation techniques(deep breathing, meditating, yoga) Do Not substitute Alcohol to help with tapering If you have been on opioids for less than two weeks and do not have pain than it is ok to stop all  together.  Plan to wean off of opioids This plan should start within one week post op of your joint replacement. Maintain the same interval or time between taking each dose and first decrease the dose.  Cut the total daily intake of opioids by one tablet each day Next start to increase the time between doses. The last dose that should be eliminated is the evening dose.     MAKE SURE YOU:  Understand these instructions.  Get help right away if you are not doing well or get worse.    Thank you for letting us be a part of your medical care team.  It is a privilege we respect greatly.  We hope these instructions will help you stay on track for a fast and full recovery!   Discharge instructions   Complete by: As directed    INSTRUCTIONS AFTER JOINT REPLACEMENT   Remove items at home which could result in a fall. This includes throw rugs or furniture in walking pathways ICE to the affected joint every three hours while awake for 30 minutes at a time, for at least the first 3-5 days, and then as needed for pain and swelling.  Continue to use ice for pain and swelling. You may notice swelling that will progress down to the foot and ankle.  This is normal after surgery.  Elevate your leg when you are not up walking on it.   Continue to use the breathing machine you got in the hospital (incentive spirometer) which will help keep your temperature down.  It is common for your temperature to cycle up and down following surgery, especially at night when you are not up moving around and exerting yourself.  The breathing machine keeps your lungs expanded and your temperature down.   DIET:  As you were doing prior to hospitalization, we recommend a well-balanced diet.  DRESSING / WOUND CARE / SHOWERING  Keep the surgical dressing until follow up.  The dressing is water proof, so you can shower without any extra covering.  IF THE DRESSING FALLS OFF or the wound gets wet inside, change the dressing with  sterile gauze.  Please use good hand washing techniques before changing the dressing.  Do not use any lotions or creams on the incision until instructed by your surgeon.    ACTIVITY  Increase activity slowly as tolerated, but follow the weight bearing instructions below.   No driving for  6 weeks or until further direction given by your physician.  You cannot drive while taking narcotics.  No lifting or carrying greater than 10 lbs. until further directed by your surgeon. Avoid periods of inactivity such as sitting longer than an hour when not asleep. This helps prevent blood clots.  You may return to work once you are authorized by your doctor.     WEIGHT BEARING   Weight bearing as tolerated with assist device (walker, cane, etc) as directed, use it as long as suggested by your surgeon or therapist, typically at least 4-6 weeks.   EXERCISES  Results after joint replacement surgery are often greatly improved when you follow the exercise, range of motion and muscle strengthening exercises prescribed by your doctor. Safety measures are also important to protect the joint from further injury. Any time any of these exercises cause you to have increased pain or swelling, decrease what you are doing until you are comfortable again and then slowly increase them. If you have problems or questions, call your caregiver or physical therapist for advice.   Rehabilitation is important following a joint replacement. After just a few days of immobilization, the muscles of the leg can become weakened and shrink (atrophy).  These exercises are designed to build up the tone and strength of the thigh and leg muscles and to improve motion. Often times heat used for twenty to thirty minutes before working out will loosen up your tissues and help with improving the range of motion but do not use heat for the first two weeks following surgery (sometimes heat can increase post-operative swelling).   These exercises  can be done on a training (exercise) mat, on the floor, on a table or on a bed. Use whatever works the best and is most comfortable for you.    Use music or television while you are exercising so that the exercises are a pleasant break in your day. This will make your life better with the exercises acting as a break in your routine that you can look forward to.   Perform all exercises about fifteen times, three times per day or as directed.  You should exercise both the operative leg and the other leg as well.  Exercises include:   Quad Sets - Tighten up the muscle on the front of the thigh (Quad) and hold for 5-10 seconds.   Straight Leg Raises - With your knee straight (if you were given a brace, keep it on), lift the leg to 60 degrees, hold for 3 seconds, and slowly lower the leg.  Perform this exercise against resistance later as your leg gets stronger.  Leg Slides: Lying on your back, slowly slide your foot toward your buttocks, bending your knee up off the floor (only go as far as is comfortable). Then slowly slide your foot back down until your leg is flat on the floor again.  Angel Wings: Lying on your back spread your legs to the side as far apart as you can without causing discomfort.  Hamstring Strength:  Lying on your back, push your heel against the floor with your leg straight by tightening up the muscles of your buttocks.  Repeat, but this time bend your knee to a comfortable angle, and push your heel against the floor.  You may put a pillow under the heel to make it more comfortable if necessary.   A rehabilitation program following joint replacement surgery can speed recovery and prevent re-injury in the future due to  weakened muscles. Contact your doctor or a physical therapist for more information on knee rehabilitation.    CONSTIPATION  Constipation is defined medically as fewer than three stools per week and severe constipation as less than one stool per week.  Even if you have a  regular bowel pattern at home, your normal regimen is likely to be disrupted due to multiple reasons following surgery.  Combination of anesthesia, postoperative narcotics, change in appetite and fluid intake all can affect your bowels.   YOU MUST use at least one of the following options; they are listed in order of increasing strength to get the job done.  They are all available over the counter, and you may need to use some, POSSIBLY even all of these options:    Drink plenty of fluids (prune juice may be helpful) and high fiber foods Colace 100 mg by mouth twice a day  Senokot for constipation as directed and as needed Dulcolax (bisacodyl), take with full glass of water  Miralax (polyethylene glycol) once or twice a day as needed.  If you have tried all these things and are unable to have a bowel movement in the first 3-4 days after surgery call either your surgeon or your primary doctor.    If you experience loose stools or diarrhea, hold the medications until you stool forms back up.  If your symptoms do not get better within 1 week or if they get worse, check with your doctor.  If you experience "the worst abdominal pain ever" or develop nausea or vomiting, please contact the office immediately for further recommendations for treatment.   ITCHING:  If you experience itching with your medications, try taking only a single pain pill, or even half a pain pill at a time.  You can also use Benadryl over the counter for itching or also to help with sleep.   TED HOSE STOCKINGS:  Use stockings on both legs until for at least 2 weeks or as directed by physician office. They may be removed at night for sleeping.  MEDICATIONS:  See your medication summary on the "After Visit Summary" that nursing will review with you.  You may have some home medications which will be placed on hold until you complete the course of blood thinner medication.  It is important for you to complete the blood thinner  medication as prescribed.  PRECAUTIONS:  If you experience chest pain or shortness of breath - call 911 immediately for transfer to the hospital emergency department.   If you develop a fever greater that 101 F, purulent drainage from wound, increased redness or drainage from wound, foul odor from the wound/dressing, or calf pain - CONTACT YOUR SURGEON.                                                   FOLLOW-UP APPOINTMENTS:  If you do not already have a post-op appointment, please call the office for an appointment to be seen by your surgeon.  Guidelines for how soon to be seen are listed in your "After Visit Summary", but are typically between 1-4 weeks after surgery.  OTHER INSTRUCTIONS:   Knee Replacement:  Do not place pillow under knee, focus on keeping the knee straight while resting. CPM instructions: 0-90 degrees, 2 hours in the morning, 2 hours in the afternoon, and 2 hours in  the evening. Place foam block, curve side up under heel at all times except when in CPM or when walking.  DO NOT modify, tear, cut, or change the foam block in any way.  POST-OPERATIVE OPIOID TAPER INSTRUCTIONS: It is important to wean off of your opioid medication as soon as possible. If you do not need pain medication after your surgery it is ok to stop day one. Opioids include: Codeine, Hydrocodone(Norco, Vicodin), Oxycodone(Percocet, oxycontin) and hydromorphone amongst others.  Long term and even short term use of opiods can cause: Increased pain response Dependence Constipation Depression Respiratory depression And more.  Withdrawal symptoms can include Flu like symptoms Nausea, vomiting And more Techniques to manage these symptoms Hydrate well Eat regular healthy meals Stay active Use relaxation techniques(deep breathing, meditating, yoga) Do Not substitute Alcohol to help with tapering If you have been on opioids for less than two weeks and do not have pain than it is ok to stop all together.   Plan to wean off of opioids This plan should start within one week post op of your joint replacement. Maintain the same interval or time between taking each dose and first decrease the dose.  Cut the total daily intake of opioids by one tablet each day Next start to increase the time between doses. The last dose that should be eliminated is the evening dose.     MAKE SURE YOU:  Understand these instructions.  Get help right away if you are not doing well or get worse.    Thank you for letting us be a part of your medical care team.  It is a privilege we respect greatly.  We hope these instructions will help you stay on track for a fast and full recovery!   Increase activity slowly as tolerated   Complete by: As directed    Increase activity slowly as tolerated   Complete by: As directed    Post-operative opioid taper instructions:   Complete by: As directed    POST-OPERATIVE OPIOID TAPER INSTRUCTIONS: It is important to wean off of your opioid medication as soon as possible. If you do not need pain medication after your surgery it is ok to stop day one. Opioids include: Codeine, Hydrocodone(Norco, Vicodin), Oxycodone(Percocet, oxycontin) and hydromorphone amongst others.  Long term and even short term use of opiods can cause: Increased pain response Dependence Constipation Depression Respiratory depression And more.  Withdrawal symptoms can include Flu like symptoms Nausea, vomiting And more Techniques to manage these symptoms Hydrate well Eat regular healthy meals Stay active Use relaxation techniques(deep breathing, meditating, yoga) Do Not substitute Alcohol to help with tapering If you have been on opioids for less than two weeks and do not have pain than it is ok to stop all together.  Plan to wean off of opioids This plan should start within one week post op of your joint replacement. Maintain the same interval or time between taking each dose and first decrease  the dose.  Cut the total daily intake of opioids by one tablet each day Next start to increase the time between doses. The last dose that should be eliminated is the evening dose.      Post-operative opioid taper instructions:   Complete by: As directed    POST-OPERATIVE OPIOID TAPER INSTRUCTIONS: It is important to wean off of your opioid medication as soon as possible. If you do not need pain medication after your surgery it is ok to stop day one. Opioids include: Codeine, Hydrocodone(Norco, Vicodin),  Oxycodone(Percocet, oxycontin) and hydromorphone amongst others.  Long term and even short term use of opiods can cause: Increased pain response Dependence Constipation Depression Respiratory depression And more.  Withdrawal symptoms can include Flu like symptoms Nausea, vomiting And more Techniques to manage these symptoms Hydrate well Eat regular healthy meals Stay active Use relaxation techniques(deep breathing, meditating, yoga) Do Not substitute Alcohol to help with tapering If you have been on opioids for less than two weeks and do not have pain than it is ok to stop all together.  Plan to wean off of opioids This plan should start within one week post op of your joint replacement. Maintain the same interval or time between taking each dose and first decrease the dose.  Cut the total daily intake of opioids by one tablet each day Next start to increase the time between doses. The last dose that should be eliminated is the evening dose.          DISCHARGE MEDICATIONS:   Allergies as of 12/06/2021       Reactions   Codeine Swelling   Morphine And Related Other (See Comments)   bradycardia        Medication List     TAKE these medications    amLODipine 2.5 MG tablet Commonly known as: NORVASC Take 2.5 mg by mouth daily.   aspirin EC 81 MG tablet Take 1 tablet (81 mg total) by mouth 2 (two) times daily. Swallow whole.   atenolol 25 MG tablet Commonly  known as: TENORMIN Take 25 mg by mouth daily.   BENGAY EX Apply 1 Application topically daily as needed (pain).   celecoxib 200 MG capsule Commonly known as: CELEBREX Take 200 mg by mouth daily as needed for mild pain.   docusate sodium 100 MG capsule Commonly known as: Colace Take 1 capsule (100 mg total) by mouth 2 (two) times daily.   furosemide 20 MG tablet Commonly known as: LASIX Take 20 mg by mouth daily as needed for fluid or edema.   methocarbamol 500 MG tablet Commonly known as: ROBAXIN Take 1-2 tablets (500-1,000 mg total) by mouth 4 (four) times daily.   omeprazole 40 MG capsule Commonly known as: PRILOSEC Take 40 mg by mouth daily.   ondansetron 4 MG disintegrating tablet Commonly known as: ZOFRAN-ODT Take 1 tablet (4 mg total) by mouth every 8 (eight) hours as needed for nausea or vomiting.   oxyCODONE 5 MG immediate release tablet Commonly known as: Oxy IR/ROXICODONE Take 1-2 tablets (5-10 mg total) by mouth every 6 (six) hours as needed for severe pain.   Vitamin D (Ergocalciferol) 1.25 MG (50000 UNIT) Caps capsule Commonly known as: DRISDOL Take 50,000 Units by mouth every Monday.   vitamin E 180 MG (400 UNITS) capsule Generic drug: vitamin E Take 400 Units by mouth daily.               Durable Medical Equipment  (From admission, onward)           Start     Ordered   12/05/21 1715  DME Walker rolling  Once       Question:  Patient needs a walker to treat with the following condition  Answer:  S/P total knee replacement   12/05/21 1714   12/05/21 1715  DME 3 n 1  Once        12/05/21 1714   12/05/21 1715  DME Bedside commode  Once       Question:  Patient needs  a bedside commode to treat with the following condition  Answer:  S/P total knee replacement   12/05/21 1714            FOLLOW UP VISIT:    DISPOSITION: HOME VS. SNF  Dental Antibiotics:  In most cases prophylactic antibiotics for Dental procdeures after total joint  surgery are not necessary.  Exceptions are as follows:  1. History of prior total joint infection  2. Severely immunocompromised (Organ Transplant, cancer chemotherapy, Rheumatoid biologic meds such as Humera)  3. Poorly controlled diabetes (A1C &gt; 8.0, blood glucose over 200)  If you have one of these conditions, contact your surgeon for an antibiotic prescription, prior to your dental procedure.   CONDITION:  Good   Guy Sandifer 12/06/2021, 12:52 PM

## 2023-01-04 ENCOUNTER — Emergency Department (HOSPITAL_BASED_OUTPATIENT_CLINIC_OR_DEPARTMENT_OTHER)
Admission: EM | Admit: 2023-01-04 | Discharge: 2023-01-04 | Disposition: A | Discharge status: Home / Self Care | Payer: No Typology Code available for payment source | Attending: Emergency Medicine | Admitting: Emergency Medicine

## 2023-01-04 ENCOUNTER — Encounter (HOSPITAL_BASED_OUTPATIENT_CLINIC_OR_DEPARTMENT_OTHER): Payer: Self-pay | Admitting: Emergency Medicine

## 2023-01-04 ENCOUNTER — Other Ambulatory Visit: Payer: Self-pay

## 2023-01-04 ENCOUNTER — Emergency Department (HOSPITAL_BASED_OUTPATIENT_CLINIC_OR_DEPARTMENT_OTHER): Payer: No Typology Code available for payment source

## 2023-01-04 DIAGNOSIS — M545 Low back pain, unspecified: Secondary | ICD-10-CM | POA: Insufficient documentation

## 2023-01-04 DIAGNOSIS — Y92410 Unspecified street and highway as the place of occurrence of the external cause: Secondary | ICD-10-CM | POA: Diagnosis not present

## 2023-01-04 DIAGNOSIS — Z7982 Long term (current) use of aspirin: Secondary | ICD-10-CM | POA: Diagnosis not present

## 2023-01-04 DIAGNOSIS — M25561 Pain in right knee: Secondary | ICD-10-CM | POA: Diagnosis not present

## 2023-01-04 DIAGNOSIS — M25562 Pain in left knee: Secondary | ICD-10-CM | POA: Insufficient documentation

## 2023-01-04 MED ORDER — LIDOCAINE 5 % EX PTCH: 1.0000 | MEDICATED_PATCH | CUTANEOUS | 0 refills | Status: AC

## 2023-01-04 MED ORDER — ACETAMINOPHEN 500 MG PO TABS
1000.0000 mg | ORAL_TABLET | Freq: Once | ORAL | Status: AC
  Administered 2023-01-04: 1000 mg via ORAL
  Filled 2023-01-04: qty 2

## 2023-01-04 MED ORDER — METHOCARBAMOL 500 MG PO TABS: 500.0000 mg | ORAL_TABLET | Freq: Three times a day (TID) | ORAL | 0 refills | Status: AC | PRN
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
